# Patient Record
Sex: Male | Born: 1950 | Race: White | Hispanic: No | Marital: Married | Smoking: Former smoker
Health system: Southern US, Community
[De-identification: ages and names within clinical notes are randomized; demographics above are authoritative.]

## PROBLEM LIST (undated history)

## (undated) DIAGNOSIS — I10 Essential (primary) hypertension: Secondary | ICD-10-CM

## (undated) DIAGNOSIS — E119 Type 2 diabetes mellitus without complications: Secondary | ICD-10-CM

---

## 2014-10-22 ENCOUNTER — Ambulatory Visit (INDEPENDENT_AMBULATORY_CARE_PROVIDER_SITE_OTHER): Payer: Medicare Other | Admitting: Family Medicine

## 2014-10-22 ENCOUNTER — Encounter: Payer: Self-pay | Admitting: Family Medicine

## 2014-10-22 VITALS — BP 142/72 | HR 68 | Temp 98.3°F | Ht 65.75 in | Wt 181.0 lb

## 2014-10-22 DIAGNOSIS — N529 Male erectile dysfunction, unspecified: Secondary | ICD-10-CM | POA: Diagnosis not present

## 2014-10-22 DIAGNOSIS — M25529 Pain in unspecified elbow: Secondary | ICD-10-CM | POA: Diagnosis not present

## 2014-10-22 DIAGNOSIS — K219 Gastro-esophageal reflux disease without esophagitis: Secondary | ICD-10-CM | POA: Insufficient documentation

## 2014-10-22 DIAGNOSIS — Z79899 Other long term (current) drug therapy: Secondary | ICD-10-CM

## 2014-10-22 DIAGNOSIS — H269 Unspecified cataract: Secondary | ICD-10-CM | POA: Diagnosis not present

## 2014-10-22 DIAGNOSIS — I10 Essential (primary) hypertension: Secondary | ICD-10-CM | POA: Diagnosis present

## 2014-10-22 DIAGNOSIS — J302 Other seasonal allergic rhinitis: Secondary | ICD-10-CM | POA: Diagnosis not present

## 2014-10-22 DIAGNOSIS — R7989 Other specified abnormal findings of blood chemistry: Secondary | ICD-10-CM | POA: Diagnosis not present

## 2014-10-22 DIAGNOSIS — K5732 Diverticulitis of large intestine without perforation or abscess without bleeding: Secondary | ICD-10-CM | POA: Diagnosis not present

## 2014-10-22 DIAGNOSIS — R748 Abnormal levels of other serum enzymes: Secondary | ICD-10-CM | POA: Diagnosis not present

## 2014-10-22 DIAGNOSIS — N400 Enlarged prostate without lower urinary tract symptoms: Secondary | ICD-10-CM

## 2014-10-22 LAB — COMPREHENSIVE METABOLIC PANEL
ALT: 27 U/L (ref 0–53)
AST: 21 U/L (ref 0–37)
Albumin: 4.4 g/dL (ref 3.5–5.2)
Alkaline Phosphatase: 55 U/L (ref 39–117)
BILIRUBIN TOTAL: 0.4 mg/dL (ref 0.2–1.2)
BUN: 24 mg/dL — ABNORMAL HIGH (ref 6–23)
CALCIUM: 9.9 mg/dL (ref 8.4–10.5)
CO2: 23 mEq/L (ref 19–32)
Chloride: 101 mEq/L (ref 96–112)
Creat: 1.11 mg/dL (ref 0.50–1.35)
GLUCOSE: 111 mg/dL — AB (ref 70–99)
Potassium: 4.3 mEq/L (ref 3.5–5.3)
Sodium: 136 mEq/L (ref 135–145)
Total Protein: 7.5 g/dL (ref 6.0–8.3)

## 2014-10-22 LAB — LIPID PANEL
CHOL/HDL RATIO: 6.1 ratio
CHOLESTEROL: 226 mg/dL — AB (ref 0–200)
HDL: 37 mg/dL — ABNORMAL LOW (ref >=40)
LDL CALC: 150 mg/dL — AB (ref 0–99)
TRIGLYCERIDES: 196 mg/dL — AB (ref <150)
VLDL: 39 mg/dL (ref 0–40)

## 2014-10-22 LAB — CBC
HCT: 46 % (ref 39.0–52.0)
Hemoglobin: 16 g/dL (ref 13.0–17.0)
MCH: 29.1 pg (ref 26.0–34.0)
MCHC: 34.8 g/dL (ref 30.0–36.0)
MCV: 83.6 fL (ref 78.0–100.0)
MPV: 11.3 fL (ref 8.6–12.4)
PLATELETS: 271 10*3/uL (ref 150–400)
RBC: 5.5 MIL/uL (ref 4.22–5.81)
RDW: 14.6 % (ref 11.5–15.5)
WBC: 9.3 10*3/uL (ref 4.0–10.5)

## 2014-10-22 LAB — TSH: TSH: 1.728 u[IU]/mL (ref 0.350–4.500)

## 2014-10-22 LAB — POCT GLYCOSYLATED HEMOGLOBIN (HGB A1C): HEMOGLOBIN A1C: 6.2

## 2014-10-22 MED ORDER — DICLOFENAC SODIUM 1 % TD GEL: 2.0000 g | Freq: Four times a day (QID) | TRANSDERMAL | Status: DC

## 2014-10-22 MED ORDER — TADALAFIL 20 MG PO TABS: 10.0000 mg | ORAL_TABLET | ORAL | Status: DC | PRN

## 2014-10-22 MED ORDER — TAMSULOSIN HCL 0.4 MG PO CAPS: 0.4000 mg | ORAL_CAPSULE | Freq: Every day | ORAL | Status: DC

## 2014-10-22 NOTE — Assessment & Plan Note (Signed)
Tenderness of lateral epicondyles and pain with pronation against resistance. Likely lateral epicondylitis. Will avoid NSAIDs given reported history of elevated creatinine. Will treat with scheduled tylenol and topical voltaren gel. Will proceed with injections and PT referral in 1 month if not improving.

## 2014-10-22 NOTE — Assessment & Plan Note (Signed)
Concern for vascular etiology given lack of nocturnal erections given lack of nocturnal erections. Will check A1c and lipid panel today. Will trial Cialis. If not improvement, would consider referral to urology for further management.

## 2014-10-22 NOTE — Assessment & Plan Note (Deleted)
Reports baseline level of 1.5-1.7. Also reports history of renal cysts.

## 2014-10-22 NOTE — Assessment & Plan Note (Signed)
Check CMP today. Continue supplements.

## 2014-10-22 NOTE — Assessment & Plan Note (Addendum)
At goal today. Continue amlodipine, lisinopril, and metoprolol.   ADDENDUM: Lab results reviewed. A1c in pre-diabetic range. Would also benefit from initiation of statin. Will discuss at next appointment.

## 2014-10-22 NOTE — Progress Notes (Addendum)
Randall Brown is a 64 y.o. male who presents to the [facility] today to establish care. His concerns today include:  HPI: Elbow Pain Patient reports several month history of bilateral elbow pain. Started after moving boxes during recent move to [place]. Initially went to an urgent care where he was given 5 days of prednisone. Was diagnosed with tendonitis. Which helped some. Two months ago went to his family doctor and had an injection in his right arm which helped some. Pain has been the same since then. Tylenol has not helped very much. Also has tried aspercream with lidocaine.   Erectile Dysfunction Patient with several year history. States that he initially had difficulty maintaining an erection but has had more difficulty initiating an erection for at least the past 6 months. Has tried Viagra which did not help. No nocturnal or early morning erections.   Hypertension BP Readings from Last 3 Encounters:  10/22/14 142/72   Home BP monitoring-Yes Compliant with medications-yes without side effects ROS-Denies any CP, HA, SOB, blurry vision, LE edema, transient weakness, orthopnea, PND.   Hypomagnesemia Reports having low magnesium. Unsure of level. Is currently taking daily magnesium supplements.    Elevated creatinine/Renal cysts Reports that his baseline creatinine is 1.5-1.7. Also reports history of renal cysts.    ROS: As per HPI, otherwise all systems reviewed and are negative.  Past Medical History - Reviewed and updated Patient Active Problem List   Diagnosis Date Noted  . Essential hypertension 10/22/2014  . BPH (benign prostatic hyperplasia) 10/22/2014  . Hypomagnesemia 10/22/2014  . Low serum vitamin D 10/22/2014  . Cataracts, both eyes 10/22/2014  . Seasonal allergies 10/22/2014  . GERD (gastroesophageal reflux disease) 10/22/2014  . Diverticulitis of colon 10/22/2014  . Elevated serum creatinine 10/22/2014  . Elbow pain 10/22/2014  . Erectile dysfunction  10/22/2014    Medications- reviewed and updated Current Outpatient Prescriptions  Medication Sig Dispense Refill  . amLODipine (NORVASC) 10 MG tablet Take 10 mg by mouth daily.    Marland Kitchen aspirin 81 MG tablet Take 81 mg by mouth daily.    . CycloSPORINE (RESTASIS OP) Apply to eye.    . diclofenac sodium (VOLTAREN) 1 % GEL Apply 2 g topically 4 (four) times daily. 100 g 1  . famotidine (PEPCID) 20 MG tablet Take 20 mg by mouth 2 (two) times daily.    Marland Kitchen lisinopril (PRINIVIL,ZESTRIL) 20 MG tablet Take 20 mg by mouth daily.    . metoprolol (LOPRESSOR) 50 MG tablet Take 50 mg by mouth 2 (two) times daily.    . Multiple Vitamin (MULTIVITAMIN) tablet Take 1 tablet by mouth daily.    . Omega-3 Fatty Acids (FISH OIL PO) Take by mouth.    . tadalafil (CIALIS) 20 MG tablet Take 0.5-1 tablets (10-20 mg total) by mouth every other day as needed for erectile dysfunction. 5 tablet 11  . tamsulosin (FLOMAX) 0.4 MG CAPS capsule Take 1 capsule (0.4 mg total) by mouth daily. 30 capsule 3   No current facility-administered medications for this visit.    Objective: Physical Exam: BP 142/72 mmHg  Pulse 68  Temp(Src) 98.3 F (36.8 C) (Oral)  Ht 5' 5.75" (1.67 m)  Wt 181 lb (82.101 kg)  BMI 29.44 kg/m2  Gen: NAD, resting comfortably CV: RRR with no murmurs appreciated Lungs: NWOB, CTAB with no crackles, wheezes, or rhonchi Abdomen: Normal bowel sounds present. Soft, Nontender, Nondistended. Ext: no edema MSK: Bilateral elbows without effusion or erythema. Mildly tender over lateral epicondyle. Pain  elicited with pronation against resistance bilaterally.  Skin: warm, dry Neuro: grossly normal, moves all extremities  Results for orders placed or performed in visit on 10/22/14 (from the past 72 hour(s))  CBC     Status: None   Collection Time: 10/22/14  9:55 AM  Result Value Ref Range   WBC 9.3 4.0 - 10.5 K/uL   RBC 5.50 4.22 - 5.81 MIL/uL   Hemoglobin 16.0 13.0 - 17.0 g/dL   HCT 16.1 09.6 - 04.5 %    MCV 83.6 78.0 - 100.0 fL   MCH 29.1 26.0 - 34.0 pg   MCHC 34.8 30.0 - 36.0 g/dL   RDW 40.9 81.1 - 91.4 %   Platelets 271 150 - 400 K/uL   MPV 11.3 8.6 - 12.4 fL  Comprehensive metabolic panel     Status: Abnormal   Collection Time: 10/22/14  9:55 AM  Result Value Ref Range   Sodium 136 135 - 145 mEq/L   Potassium 4.3 3.5 - 5.3 mEq/L   Chloride 101 96 - 112 mEq/L   CO2 23 19 - 32 mEq/L   Glucose, Bld 111 (H) 70 - 99 mg/dL   BUN 24 (H) 6 - 23 mg/dL   Creat 7.82 9.56 - 2.13 mg/dL   Total Bilirubin 0.4 0.2 - 1.2 mg/dL   Alkaline Phosphatase 55 39 - 117 U/L   AST 21 0 - 37 U/L   ALT 27 0 - 53 U/L   Total Protein 7.5 6.0 - 8.3 g/dL   Albumin 4.4 3.5 - 5.2 g/dL   Calcium 9.9 8.4 - 08.6 mg/dL  Lipid Panel     Status: Abnormal   Collection Time: 10/22/14  9:55 AM  Result Value Ref Range   Cholesterol 226 (H) 0 - 200 mg/dL    Comment: ATP III Classification:       < 200        mg/dL        Desirable      578 - 239     mg/dL        Borderline High      >= 240        mg/dL        High      Triglycerides 196 (H) <150 mg/dL   HDL 37 (L) >=46 mg/dL    Comment: ** Please note change in reference range(s). **   Total CHOL/HDL Ratio 6.1 Ratio   VLDL 39 0 - 40 mg/dL   LDL Cholesterol 962 (H) 0 - 99 mg/dL    Comment:   Total Cholesterol/HDL Ratio:CHD Risk                        Coronary Heart Disease Risk Table                                        Men       Women          1/2 Average Risk              3.4        3.3              Average Risk              5.0        4.4  2X Average Risk              9.6        7.1           3X Average Risk             23.4       11.0 Use the calculated Patient Ratio above and the CHD Risk table  to determine the patient's CHD Risk. ATP III Classification (LDL):       < 100        mg/dL         Optimal      045 - 129     mg/dL         Near or Above Optimal      130 - 159     mg/dL         Borderline High      160 - 189     mg/dL          High       > 409        mg/dL         Very High     POCT glycosylated hemoglobin (Hb A1C)     Status: None   Collection Time: 10/22/14  9:58 AM  Result Value Ref Range   Hemoglobin A1C 6.2   TSH     Status: None   Collection Time: 10/22/14 10:17 AM  Result Value Ref Range   TSH 1.728 0.350 - 4.500 uIU/mL    A/P: See problem list  Elbow pain Tenderness of lateral epicondyles and pain with pronation against resistance. Likely lateral epicondylitis. Will avoid NSAIDs given reported history of elevated creatinine. Will treat with scheduled tylenol and topical voltaren gel. Will proceed with injections and PT referral in 1 month if not improving.   Erectile dysfunction Concern for vascular etiology given lack of nocturnal erections given lack of nocturnal erections. Will check A1c and lipid panel today. Will trial Cialis. If not improvement, would consider referral to urology for further management.   Hypomagnesemia Check CMP today. Continue supplements.   Elevated serum creatinine CMP today. Possible referral to nephrology if CKD3 or higher. Will obtain UA at next visit to evaluate for proteinuria.   Essential hypertension At goal today. Continue amlodipine, lisinopril, and metoprolol.   ADDENDUM: Lab results reviewed. A1c in pre-diabetic range. Would also benefit from initiation of statin. Will discuss at next appointment.     Orders Placed This Encounter  Procedures  . CBC  . Comprehensive metabolic panel  . Lipid Panel  . TSH  . POCT glycosylated hemoglobin (Hb A1C)    Meds ordered this encounter  Medications  . diclofenac sodium (VOLTAREN) 1 % GEL    Sig: Apply 2 g topically 4 (four) times daily.    Dispense:  100 g    Refill:  1  . tadalafil (CIALIS) 20 MG tablet    Sig: Take 0.5-1 tablets (10-20 mg total) by mouth every other day as needed for erectile dysfunction.    Dispense:  5 tablet    Refill:  11  . tamsulosin (FLOMAX) 0.4 MG CAPS capsule    Sig: Take 1  capsule (0.4 mg total) by mouth daily.    Dispense:  30 capsule    Refill:  3  . lisinopril (PRINIVIL,ZESTRIL) 20 MG tablet    Sig: Take 20 mg by mouth daily.  Marland Kitchen amLODipine (NORVASC) 10 MG tablet  Sig: Take 10 mg by mouth daily.  . metoprolol (LOPRESSOR) 50 MG tablet    Sig: Take 50 mg by mouth 2 (two) times daily.  . Multiple Vitamin (MULTIVITAMIN) tablet    Sig: Take 1 tablet by mouth daily.  . Omega-3 Fatty Acids (FISH OIL PO)    Sig: Take by mouth.  . CycloSPORINE (RESTASIS OP)    Sig: Apply to eye.  . famotidine (PEPCID) 20 MG tablet    Sig: Take 20 mg by mouth 2 (two) times daily.  Marland Kitchen aspirin 81 MG tablet    Sig: Take 81 mg by mouth daily.     Katina Degree. Jimmey Ralph, MD [facility] Medicine Resident PGY-1 10/23/2014 8:43 AM

## 2014-10-22 NOTE — Patient Instructions (Signed)
Thank you for coming to the clinic today. It was nice seeing you.  For your tendonitis. Please take tylenol every 6 hours. We have also prescribed you voltaren gel. Please use this as needed. If your symptoms are not improving in 1 month, please come back and we can discuss repeating the injections.  For your ED, we will send in a prescription for cialis today. If this does not help, you may need a referral to urology. Please call us and let us know if this is the case.  We will be checking your cholesterol, blood sugar, kidney function, and blood counts today. You should receive a call or a letter in the mail with these results.  Please come back in 3 months or sooner if you need anything else.

## 2014-10-22 NOTE — Assessment & Plan Note (Signed)
CMP today. Possible referral to nephrology if CKD3 or higher. Will obtain UA at next visit to evaluate for proteinuria.

## 2014-10-23 ENCOUNTER — Encounter: Payer: Self-pay | Admitting: Family Medicine

## 2014-12-06 ENCOUNTER — Encounter: Payer: Self-pay | Admitting: Family Medicine

## 2014-12-06 NOTE — Telephone Encounter (Deleted)
Patient want to have a referral to a dermatologist for removal of skin tags

## 2014-12-17 NOTE — Telephone Encounter (Signed)
This encounter was created in error - please disregard.

## 2015-01-07 ENCOUNTER — Encounter: Payer: Self-pay | Admitting: Family Medicine

## 2015-01-07 ENCOUNTER — Other Ambulatory Visit: Payer: Self-pay | Admitting: Family Medicine

## 2015-01-07 MED ORDER — AMLODIPINE BESYLATE 10 MG PO TABS: 10.0000 mg | ORAL_TABLET | Freq: Every day | ORAL | Status: DC

## 2015-01-21 ENCOUNTER — Encounter: Payer: Self-pay | Admitting: Family Medicine

## 2015-01-21 ENCOUNTER — Ambulatory Visit (INDEPENDENT_AMBULATORY_CARE_PROVIDER_SITE_OTHER): Payer: Medicare Other | Admitting: Family Medicine

## 2015-01-21 VITALS — BP 144/77 | HR 85 | Temp 98.6°F | Ht 65.75 in | Wt 190.0 lb

## 2015-01-21 DIAGNOSIS — Z23 Encounter for immunization: Secondary | ICD-10-CM

## 2015-01-21 DIAGNOSIS — R7309 Other abnormal glucose: Secondary | ICD-10-CM

## 2015-01-21 DIAGNOSIS — E119 Type 2 diabetes mellitus without complications: Secondary | ICD-10-CM | POA: Insufficient documentation

## 2015-01-21 DIAGNOSIS — N529 Male erectile dysfunction, unspecified: Secondary | ICD-10-CM | POA: Diagnosis not present

## 2015-01-21 DIAGNOSIS — E785 Hyperlipidemia, unspecified: Secondary | ICD-10-CM | POA: Insufficient documentation

## 2015-01-21 DIAGNOSIS — I10 Essential (primary) hypertension: Secondary | ICD-10-CM | POA: Diagnosis not present

## 2015-01-21 DIAGNOSIS — M79602 Pain in left arm: Secondary | ICD-10-CM

## 2015-01-21 DIAGNOSIS — R7303 Prediabetes: Secondary | ICD-10-CM

## 2015-01-21 MED ORDER — SITAGLIPTIN PHOSPHATE 100 MG PO TABS: 100.0000 mg | ORAL_TABLET | Freq: Every day | ORAL | Status: DC

## 2015-01-21 MED ORDER — ATORVASTATIN CALCIUM 40 MG PO TABS: 40.0000 mg | ORAL_TABLET | Freq: Every day | ORAL | Status: DC

## 2015-01-21 NOTE — Assessment & Plan Note (Signed)
Well controlled today. Will continue metoprolol, lisinopril, and amlodipine.

## 2015-01-21 NOTE — Assessment & Plan Note (Signed)
A1c 6.2 in June. Given patient's history of CKD, will not start Metformin at this time. Will start Januvia at this time. Can also consider sulfonylurea or SGLT-2 inhibitor at next office visit if patient intolerant or unable to afford. Would not stack therapy until A1c >7.0. Encouraged healthy lifestyles choices.

## 2015-01-21 NOTE — Assessment & Plan Note (Signed)
Patient unable to afford cialis. Less likely to be psychogenic etiology given lack of nocturnal or early morning erections. Doubt vascular etiologies given lack of other signs of peripheral vascular disease, though patient is at increased risk. Will refer to urology.

## 2015-01-21 NOTE — Progress Notes (Signed)
Subjective:  Randall Brown is a 64 y.o. male who presents to the [facility] today with a chief complaint of erectile dysfunction follow up.   HPI:  ED Discussed at last office visit. Was prescribed cialis at that time, but was unable to pick up prescription because of cost. Has continued to have difficulties initiating an erection. Has been on viagra in the past which has not helped. No nocturnal or early morning erections.   Prediabetes A1c 6.2 in June. Patient currently not on any medications. States that he has gained about 10 pounds since last visit. States that he tries to walk about 15 minutes every day but is unable to tolerate fully. Continues to endorse poor eating habits with lots of fried foods.  ROS- Denies Polyuria,Polydipsia, nocturia, Vision changes, feet or hand numbness/pain/tingling. Denies  Hypoglycemia symptoms (shaky, sweaty, hungry, weak anxious, tremor, palpitations, confusion, behavior change).   Hypertension BP Readings from Last 3 Encounters:  01/21/15 144/77  10/22/14 142/72   Home BP monitoring-Yes Compliant with medications-yes without side effects ROS-Denies any CP, HA, SOB, blurry vision, LE edema, transient weakness, orthopnea, PND.   Left Arm Pain / Numbness. Started about a month ago. Happens daily and occurs several times per day. States that he feels like his arm is falling asleep. Numbness mostly located in first three digits. No trauma, injury, or precipitating events. Has never had anything like this before. Currently stable. No medications tried.   ROS: Per HPI  PMH:  The following were reviewed and entered/updated in epic: No past medical history on file. Patient Active Problem List   Diagnosis Date Noted  . Prediabetes 01/21/2015  . Hyperlipidemia 01/21/2015  . Left arm pain 01/21/2015  . Essential hypertension 10/22/2014  . BPH (benign prostatic hyperplasia) 10/22/2014  . Hypomagnesemia 10/22/2014  . Low serum vitamin D 10/22/2014    . Cataracts, both eyes 10/22/2014  . Seasonal allergies 10/22/2014  . GERD (gastroesophageal reflux disease) 10/22/2014  . Diverticulitis of colon 10/22/2014  . Elevated serum creatinine 10/22/2014  . Elbow pain 10/22/2014  . Erectile dysfunction 10/22/2014   No past surgical history on file.    Objective:  Physical Exam: BP 144/77 mmHg  Pulse 85  Temp(Src) 98.6 F (37 C) (Oral)  Ht 5' 5.75" (1.67 m)  Wt 190 lb (86.183 kg)  BMI 30.90 kg/m2  Gen: NAD, resting comfortably CV: RRR with no murmurs appreciated Lungs: NWOB, CTAB with no crackles, wheezes, or rhonchi GI: Normal bowel sounds present. Soft, Nontender, Nondistended. MSK:  - Left arm: No deformities. Nontender to palpation. Strength 5/5 throughout. Negative Tinel's at medial epicondyle. Negative Phalen. - Neck: No step offs or deformities. Negative Spurling. FROM Skin: warm, dry Neuro: grossly normal, moves all extremities Psych: Normal affect and thought content   Assessment/Plan:  Erectile dysfunction Patient unable to afford cialis. Less likely to be psychogenic etiology given lack of nocturnal or early morning erections. Doubt vascular etiologies given lack of other signs of peripheral vascular disease, though patient is at increased risk. Will refer to urology.   Prediabetes A1c 6.2 in June. Given patient's history of CKD, will not start Metformin at this time. Will start Januvia at this time. Can also consider sulfonylurea or SGLT-2 inhibitor at next office visit if patient intolerant or unable to afford. Would not stack therapy until A1c >7.0. Encouraged healthy lifestyles choices.   Essential hypertension Well controlled today. Will continue metoprolol, lisinopril, and amlodipine.   Hyperlipidemia Will start atorvastatin  daily today. Continue daily  baby aspirin.   Left arm pain Likely secondary to slight nerve irritation. Anticipate improvement with rest and time. Did not prescribe a course of NSAIDS  today due to CKD. If continues to have issues, may consider a short steroid burst.     Caleb M. Jimmey Ralph, MD [facility] Family Medicine Resident PGY-2 01/21/2015 4:25 PM

## 2015-01-21 NOTE — Patient Instructions (Signed)
Thank you for coming to the clinic today. It was nice seeing you.  We will refer you to urology.  For your blood sugar, we will start a new medication called Januvia today. This medication is usually well tolerated. We will recheck your sugar in 3 months.  We will start a new medication called atorvastatin for your cholesterol today. This medication can cause muscle aches. Please let us know if that happens.  Your blood pressure looked good today.  Your arm pain is likely due to an inflammed nerve. This usually goes away with time. If you are still having problems, please let us know so that we can do further work up.  See you again in 3 months.  Take care,  Dr Jimmey Ralph

## 2015-01-21 NOTE — Assessment & Plan Note (Signed)
Likely secondary to slight nerve irritation. Anticipate improvement with rest and time. Did not prescribe a course of NSAIDS today due to CKD. If continues to have issues, may consider a short steroid burst.

## 2015-01-21 NOTE — Assessment & Plan Note (Signed)
Will start atorvastatin  daily today. Continue daily baby aspirin.

## 2015-03-01 ENCOUNTER — Other Ambulatory Visit: Payer: Self-pay | Admitting: Family Medicine

## 2015-03-01 NOTE — Telephone Encounter (Signed)
Rx filled.  Randall Degreealeb M. Jimmey RalphParker, MD [facility] Resident PGY-2 03/01/2015 11:07 AM

## 2015-03-02 ENCOUNTER — Encounter: Payer: Self-pay | Admitting: Family Medicine

## 2015-03-04 MED ORDER — TAMSULOSIN HCL 0.4 MG PO CAPS: 0.4000 mg | ORAL_CAPSULE | Freq: Every day | ORAL | Status: DC

## 2015-03-04 NOTE — Telephone Encounter (Signed)
Rx filled.  Katina Degreealeb M. Jimmey RalphParker, MD [facility] Resident PGY-2 03/04/2015 8:53 AM

## 2015-04-18 ENCOUNTER — Other Ambulatory Visit: Payer: Self-pay | Admitting: Family Medicine

## 2015-04-18 NOTE — Telephone Encounter (Signed)
Rx filled.  Randall Degreealeb M. Jimmey RalphParker, MD [facility] Resident PGY-2 04/18/2015 5:40 PM

## 2015-07-09 ENCOUNTER — Ambulatory Visit (INDEPENDENT_AMBULATORY_CARE_PROVIDER_SITE_OTHER): Payer: Medicare Other | Admitting: Family Medicine

## 2015-07-09 ENCOUNTER — Encounter: Payer: Self-pay | Admitting: Family Medicine

## 2015-07-09 VITALS — BP 160/76 | HR 76 | Temp 97.5°F | Ht 65.75 in | Wt 196.2 lb

## 2015-07-09 DIAGNOSIS — H6122 Impacted cerumen, left ear: Secondary | ICD-10-CM

## 2015-07-09 NOTE — Progress Notes (Signed)
   Subjective: CC: decreased hearing/ clogged ear Randall Brown is a 65 y.o. male presenting to clinic today for same day appointment. PCP: Jacquiline Doe, MD Concerns today include:  1. Decreased hearing/ clogged ear Patient reports that his Left ear is clogged with wax.  He notes that he frequently uses q-tips.  His wife tried to remove wax with peroxide last night with no success.  He reports that he used to see an ENT at least once yearly to have his ears flushed.  Notes decreased hearing due to wax but otherwise normal hearing.  No cough, congestion.  FamHx and MedHx reviewed.  ROS: Per HPI  Objective: Office vital signs reviewed. BP 160/76 mmHg  Pulse 76  Temp(Src) 97.5 F (36.4 C) (Oral)  Ht 5' 5.75" (1.67 m)  Wt 196 lb 3.2 oz (88.996 kg)  BMI 31.91 kg/m2  Physical Examination:  General: Awake, alert, well nourished, No acute distress HEENT: Normal    Neck: No masses palpated. No lymphadenopathy    Ears: R Tympanic membranes intact, normal light reflex, no erythema, no bulging; Left TM occluded by dark wax.      Eyes: PERRLA, EOMI    Nose: nasal turbinates moist    Throat: moist mucus membranes, no erythema  Assessment/ Plan: 65 y.o. male   1. Cerumen impaction, left - Ear flush performed w/ peroxide water mix. Patient tolerated flush well.  Flush successful with complete removal of cerumen and improvement in hearing. - Recommended using Debrox/ peroxide solution before coming to office for flushes in the future. - Recommended AGAINST continued QTip use - Follow up with Dr Jimmey Ralph as scheduled.   Raliegh Ip, DO PGY-2, [facility]

## 2015-07-22 ENCOUNTER — Encounter: Payer: Self-pay | Admitting: Family Medicine

## 2015-07-22 ENCOUNTER — Ambulatory Visit (INDEPENDENT_AMBULATORY_CARE_PROVIDER_SITE_OTHER): Payer: Medicare Other | Admitting: Family Medicine

## 2015-07-22 VITALS — BP 130/88 | HR 71 | Temp 98.2°F | Ht 66.0 in | Wt 197.0 lb

## 2015-07-22 DIAGNOSIS — I10 Essential (primary) hypertension: Secondary | ICD-10-CM | POA: Diagnosis present

## 2015-07-22 DIAGNOSIS — Z1159 Encounter for screening for other viral diseases: Secondary | ICD-10-CM | POA: Diagnosis not present

## 2015-07-22 DIAGNOSIS — Z23 Encounter for immunization: Secondary | ICD-10-CM | POA: Diagnosis not present

## 2015-07-22 DIAGNOSIS — G4719 Other hypersomnia: Secondary | ICD-10-CM | POA: Diagnosis not present

## 2015-07-22 DIAGNOSIS — E785 Hyperlipidemia, unspecified: Secondary | ICD-10-CM

## 2015-07-22 DIAGNOSIS — R7303 Prediabetes: Secondary | ICD-10-CM | POA: Diagnosis not present

## 2015-07-22 DIAGNOSIS — K219 Gastro-esophageal reflux disease without esophagitis: Secondary | ICD-10-CM

## 2015-07-22 DIAGNOSIS — Z79899 Other long term (current) drug therapy: Secondary | ICD-10-CM | POA: Diagnosis not present

## 2015-07-22 DIAGNOSIS — R0683 Snoring: Secondary | ICD-10-CM | POA: Diagnosis not present

## 2015-07-22 DIAGNOSIS — Z114 Encounter for screening for human immunodeficiency virus [HIV]: Secondary | ICD-10-CM | POA: Diagnosis not present

## 2015-07-22 LAB — LDL CHOLESTEROL, DIRECT: LDL DIRECT: 91 mg/dL (ref <130)

## 2015-07-22 LAB — COMPLETE METABOLIC PANEL WITH GFR
ALBUMIN: 4.4 g/dL (ref 3.6–5.1)
ALK PHOS: 57 U/L (ref 40–115)
ALT: 32 U/L (ref 9–46)
AST: 24 U/L (ref 10–35)
BILIRUBIN TOTAL: 0.5 mg/dL (ref 0.2–1.2)
BUN: 24 mg/dL (ref 7–25)
CO2: 28 mmol/L (ref 20–31)
CREATININE: 1.24 mg/dL (ref 0.70–1.25)
Calcium: 10 mg/dL (ref 8.6–10.3)
Chloride: 100 mmol/L (ref 98–110)
GFR, EST NON AFRICAN AMERICAN: 61 mL/min (ref >=60)
GFR, Est African American: 70 mL/min (ref >=60)
GLUCOSE: 133 mg/dL — AB (ref 65–99)
Potassium: 4.7 mmol/L (ref 3.5–5.3)
SODIUM: 138 mmol/L (ref 135–146)
TOTAL PROTEIN: 7.2 g/dL (ref 6.1–8.1)

## 2015-07-22 LAB — CBC
HCT: 45.1 % (ref 39.0–52.0)
HEMOGLOBIN: 15.1 g/dL (ref 13.0–17.0)
MCH: 29.3 pg (ref 26.0–34.0)
MCHC: 33.5 g/dL (ref 30.0–36.0)
MCV: 87.4 fL (ref 78.0–100.0)
MPV: 11.1 fL (ref 8.6–12.4)
PLATELETS: 212 10*3/uL (ref 150–400)
RBC: 5.16 MIL/uL (ref 4.22–5.81)
RDW: 13.8 % (ref 11.5–15.5)
WBC: 8.8 10*3/uL (ref 4.0–10.5)

## 2015-07-22 LAB — POCT GLYCOSYLATED HEMOGLOBIN (HGB A1C): Hemoglobin A1C: 7.3

## 2015-07-22 LAB — MAGNESIUM: Magnesium: 1.4 mg/dL — ABNORMAL LOW (ref 1.5–2.5)

## 2015-07-22 MED ORDER — RANITIDINE HCL 150 MG PO TABS: 150.0000 mg | ORAL_TABLET | Freq: Two times a day (BID) | ORAL | Status: DC

## 2015-07-22 NOTE — Assessment & Plan Note (Signed)
Will stop famotidine and start ranitidine. If still having symptoms, will consider short trial of PPI.

## 2015-07-22 NOTE — Assessment & Plan Note (Signed)
Will check A1c today. Continue [place]uvia.

## 2015-07-22 NOTE — Progress Notes (Signed)
    Subjective:  Randall Brown is a 65 y.o. male [facility] with a chief complaint of hypertension follow up.   HPI:  Hypertension BP Readings from Last 3 Encounters:  07/22/15 130/88  07/09/15 160/76  01/21/15 144/77   Home BP monitoring-No Compliant with medications-yes, without side effects ROS-Denies any CP, HA, SOB, blurry vision, LE edema, transient weakness, orthopnea, PND.   Prediabetes Started on [place]ajanuvia approximately 6 months ago. Has tolerated well without side effects.   HLD Started on lipitor 40mg  six months ago. Initially had some muscle aches, but has been doing well since then without side effects.   GERD Has been taking pepcid twice daily, however symptoms have worsened over the past several weeks. Was previously well controlled on nexium, however patient would like to avoid PPIs if possible due to the side effects.   Daytime Sleepiness Patient notes excessive daytime sleepiness for the past 3-4 months. He can fall asleep after sitting down for less than 5 minutes. Patient sometimes will doze off during conversations. Patient does have a history of snoring and says that is wife sometimes has to wake him up at night to breath. Had a sleep study done 20 years ago. Does not think that he will be able to wear a CPAP machine due to claustrophobia.   ROS: Per HPI  PMH: Smoking history reviewed.   Objective:  Physical Exam: BP 130/88 mmHg  Pulse 71  Temp(Src) 98.2 F (36.8 C) (Oral)  Ht 5\' 6"  (1.676 m)  Wt 197 lb (89.359 kg)  BMI 31.81 kg/m2  Gen: NAD, resting comfortably CV: RRR with no murmurs appreciated Pulm: NWOB, CTAB with no crackles, wheezes, or rhonchi GI: Obese, Normal bowel sounds present. Soft, Nontender, Nondistended. MSK: no edema, cyanosis, or clubbing noted Skin: warm, dry Neuro: grossly normal, moves all extremities Psych: Normal affect and thought content  Assessment/Plan:  Essential hypertension Well controlled  today. Continue amlodipine, lisinopril, and metoprolol.   Prediabetes Will check A1c today. Continue [place]uvia.   Hyperlipidemia Will check direct LDL today. Continue atorvastatin 40mg  daily.   GERD (gastroesophageal reflux disease) Will stop famotidine and start ranitidine. If still having symptoms, will consider short trial of PPI.   Excessive daytime sleepiness Concern for OSA given constellation of snoring, apneic episodes, and daytime sleepiness. Will obtain sleep study.   Katina Degreealeb M. Jimmey RalphParker, MD [facility] Resident PGY-2 07/22/2015 12:07 PM

## 2015-07-22 NOTE — Patient Instructions (Signed)
Please continue your medications.  We will check blood work today.  Please try taking ranitidine to see if that helps with your reflux.  We will set you up to have a sleep study done.  Please come back in 3-6 months, or sooner if you need anything else.  Take care,  Dr Jimmey RalphParker

## 2015-07-22 NOTE — Assessment & Plan Note (Signed)
Will check direct LDL today. Continue atorvastatin 40mg  daily.

## 2015-07-22 NOTE — Assessment & Plan Note (Signed)
Well controlled today. Continue amlodipine, lisinopril, and metoprolol.

## 2015-07-22 NOTE — Assessment & Plan Note (Signed)
Concern for OSA given constellation of snoring, apneic episodes, and daytime sleepiness. Will obtain sleep study.

## 2015-07-23 LAB — HIV ANTIBODY (ROUTINE TESTING W REFLEX): HIV: NONREACTIVE

## 2015-07-23 LAB — HEPATITIS C ANTIBODY: HCV Ab: NEGATIVE

## 2015-07-24 ENCOUNTER — Ambulatory Visit: Payer: Medicare Other | Admitting: Family Medicine

## 2015-07-26 ENCOUNTER — Ambulatory Visit (HOSPITAL_BASED_OUTPATIENT_CLINIC_OR_DEPARTMENT_OTHER): Payer: Medicare Other | Attending: Family Medicine

## 2015-07-26 VITALS — Ht 65.0 in | Wt 195.0 lb

## 2015-07-26 DIAGNOSIS — Z6832 Body mass index (BMI) 32.0-32.9, adult: Secondary | ICD-10-CM | POA: Insufficient documentation

## 2015-07-26 DIAGNOSIS — E669 Obesity, unspecified: Secondary | ICD-10-CM | POA: Diagnosis not present

## 2015-07-26 DIAGNOSIS — I119 Hypertensive heart disease without heart failure: Secondary | ICD-10-CM | POA: Diagnosis present

## 2015-07-26 DIAGNOSIS — R5383 Other fatigue: Secondary | ICD-10-CM | POA: Diagnosis not present

## 2015-07-26 DIAGNOSIS — G4736 Sleep related hypoventilation in conditions classified elsewhere: Secondary | ICD-10-CM | POA: Diagnosis not present

## 2015-07-26 DIAGNOSIS — Z79899 Other long term (current) drug therapy: Secondary | ICD-10-CM | POA: Insufficient documentation

## 2015-07-26 DIAGNOSIS — R0683 Snoring: Secondary | ICD-10-CM | POA: Diagnosis not present

## 2015-07-26 DIAGNOSIS — Z7982 Long term (current) use of aspirin: Secondary | ICD-10-CM | POA: Diagnosis not present

## 2015-07-26 DIAGNOSIS — G4733 Obstructive sleep apnea (adult) (pediatric): Secondary | ICD-10-CM | POA: Diagnosis not present

## 2015-07-29 ENCOUNTER — Telehealth: Payer: Self-pay | Admitting: Family Medicine

## 2015-07-29 NOTE — Telephone Encounter (Signed)
Called patient to discuss results. A1c elevated and magnesium slightly below normal. No new changes today, though will likely increased DM regimen at next appointment.  Katina Degreealeb M. Jimmey RalphParker, MD [facility] Resident PGY-2 07/29/2015 3:42 PM

## 2015-08-03 DIAGNOSIS — R0683 Snoring: Secondary | ICD-10-CM | POA: Diagnosis not present

## 2015-08-03 NOTE — Progress Notes (Signed)
  Patient Name: Randall Brown, Randall Brown Study Date: 07/26/2015 Gender: Male D.O.B: [birth year] Age (years): [age] Referring Provider: Ardith Darkaleb M Parker Height (inches): 65 Interpreting Physician: Jetty Duhamellinton Young MD, ABSM Weight (lbs): 195 RPSGT: Randall Brown, Fred BMI: 32 MRN: [id number] Neck Size: 17.50 CLINICAL INFORMATION Sleep Study Type: NPSG Indication for sleep study: Fatigue, Hypertension, Obesity, OSA, Snoring, Witnessed Apneas Epworth Sleepiness Score: 21  SLEEP STUDY TECHNIQUE As per the AASM Manual for the Scoring of Sleep and Associated Events v2.3 (April 2016) with a hypopnea requiring 4% desaturations. The channels recorded and monitored were frontal, central and occipital EEG, electrooculogram (EOG), submentalis EMG (chin), nasal and oral airflow, thoracic and abdominal wall motion, anterior tibialis EMG, snore microphone, electrocardiogram, and pulse oximetry.  MEDICATIONS Patient's medications include: charted for review Medications self-administered by patient during sleep study : ATORVASTATIN, ASPIRIN, METOPROLOL, TAMSULOSIN HYDROCHLORIDE, ZYRTEC, LISINOPRIL, MAGNESIUM CARBONATE, CHLOROTHIAZIDE, FISH OIL.  SLEEP ARCHITECTURE The study was initiated at 10:33:03 PM and ended at 5:29:04 AM. Sleep onset time was 18.2 minutes and the sleep efficiency was 81.0%. The total sleep time was 336.8 minutes. Stage REM latency was 133.0 minutes. The patient spent 4.75% of the night in stage N1 sleep, 82.04% in stage N2 sleep, 0.00% in stage N3 and 13.21% in REM. Alpha intrusion was absent. Supine sleep was 22.03%.  RESPIRATORY PARAMETERS The overall apnea/hypopnea index (AHI) was 25.5 per hour. There were 30 total apneas, including 17 obstructive, 8 central and 5 mixed apneas. There were 113 hypopneas and 0 RERAs. The AHI during Stage REM sleep was 22.9 per hour. AHI while supine was 59.8 per hour. The mean oxygen saturation was 91.12%. The minimum SpO2 during sleep was 73.00%. Loud  snoring was noted during this study.  CARDIAC DATA The 2 lead EKG demonstrated sinus rhythm. The mean heart rate was 70.66 beats per minute. Other EKG findings include: None.  LEG MOVEMENT DATA The total PLMS were 288 with a resulting PLMS index of 51.31. Associated arousal with leg movement index was 0.2 .  IMPRESSIONS - Moderate obstructive sleep apnea occurred during this study (AHI = 25.5/h). - There were not enough early events to meet protocol requirements for split CPAP titration. - No significant central sleep apnea occurred during this study (CAI = 1.4/h). - Moderate oxygen desaturation was noted during this study (Min O2 = 73.00%). - The patient snored with Loud snoring volume. - No cardiac abnormalities were noted during this study. - Severe periodic limb movements of sleep occurred during the study. No significant associated arousals  DIAGNOSIS - Obstructive Sleep Apnea (327.23 [G47.33 ICD-10]) - Nocturnal Hypoxemia (327.26 [G47.36 ICD-10])  RECOMMENDATIONS - Therapeutic CPAP titration to determine optimal pressure required to alleviate sleep disordered breathing. - Positional therapy avoiding supine position during sleep. - Avoid alcohol, sedatives and other CNS depressants that may worsen sleep apnea and disrupt normal sleep architecture. - Sleep hygiene should be reviewed to assess factors that may improve sleep quality. - Weight management and regular exercise should be initiated or continued if appropriate.  Waymon BudgeYOUNG,CLINTON D Diplomate, American Board of Sleep Medicine  ELECTRONICALLY SIGNED ON:  08/03/2015, 10:57 AM [facility] PH: [phone]   FX: [phone] ACCREDITED BY THE AMERICAN ACADEMY OF SLEEP MEDICINE

## 2015-08-13 ENCOUNTER — Ambulatory Visit (INDEPENDENT_AMBULATORY_CARE_PROVIDER_SITE_OTHER): Payer: Medicare Other | Admitting: Family Medicine

## 2015-08-13 ENCOUNTER — Encounter: Payer: Self-pay | Admitting: Family Medicine

## 2015-08-13 VITALS — BP 142/65 | HR 77 | Temp 98.2°F | Wt 194.4 lb

## 2015-08-13 DIAGNOSIS — R1030 Lower abdominal pain, unspecified: Secondary | ICD-10-CM

## 2015-08-13 DIAGNOSIS — R1032 Left lower quadrant pain: Secondary | ICD-10-CM

## 2015-08-13 DIAGNOSIS — M25552 Pain in left hip: Secondary | ICD-10-CM

## 2015-08-13 DIAGNOSIS — M25551 Pain in right hip: Secondary | ICD-10-CM

## 2015-08-13 NOTE — Patient Instructions (Signed)
You may have a small hernia causing your pain. Heavy lifting will make this worse.  Take the tylenol 500-1000mg  every 4-8 hours. No more than 4000mg  in a 24 hour period (can cause liver damage)   The address for [facility] is below:  Address: [address], [place], [place] [address] [phone]  Address: [address], [place], [place] [address] Phone:(336) [phone]

## 2015-08-13 NOTE — Progress Notes (Signed)
   Subjective:    Patient ID: Randall Brown, male    DOB: 09/05/1950, 65 y.o.   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 Pain developed somewhat gradually, not after any lifting.  Pain was "extreme", located in left groin, at its worse was "32 out of 10"; now feels like a dull ache 1/10.   Pain does not radiate  Better with sitting down, worse with prolonged standing, walking  Has tried tylenol and helps some  Does not feel like he has had any masses or swelling in the area  No major trauma to the area that he knows  Denies any weakness  Does have numbness on the front outside of his left thigh that radiates down to his thigh, however this is chronic >3 months and he has seen PCP for this with suspected increase in weight causing pressure on nerve.   Also has a history of bilateral hip pain, present for at least several years, worse walking up steps ROS: no bowel or bladder incontinence.  Social Hx: former smoker  Review of Systems   See HPI for ROS.   Past medical history, surgical, family, and social history reviewed and updated in the EMR as appropriate.  Objective:  BP 142/65 mmHg  Pulse 77  Temp(Src) 98.2 F (36.8 C) (Oral)  Wt 194 lb 6.4 oz (88.179 kg) Vitals and nursing note reviewed  General: no apparent distress  MSK: there is tenderness to the inguinal groin crease on the left side, no obvious mass. Hernia check showed no evidence of bulge with valsalva. There is no crepitation or swelling around the hip joint. Log roll test is normal, FABER and FABIR tests normal with no limitation of ROM.   Assessment & Plan:   1. Left groin pain ?inguinal hernia vs MSK. Recommended observation and rest, tylenol, follow up if not improving or develops swelling/mass in next 4-6 weeks.  2. Bilateral hip  pain During discussion brings up that he has had a lot of pain in both hips for years, including going up steps. Recommended initial imaging to evaluate for OA. Follow up as needed. - DG HIPS BILAT WITH PELVIS MIN 5 VIEWS; Future

## 2015-08-14 ENCOUNTER — Encounter: Payer: Self-pay | Admitting: Family Medicine

## 2015-08-14 ENCOUNTER — Telehealth: Payer: Self-pay | Admitting: Family Medicine

## 2015-08-14 NOTE — Telephone Encounter (Signed)
Pt has decided that he does want to have the hernia surgery. Please do the referral. jw

## 2015-08-14 NOTE — Telephone Encounter (Signed)
Will forward to Dr. Waynetta SandyWight who saw patient for this concern.  Blakely Gluth,CMA

## 2015-08-15 ENCOUNTER — Encounter: Payer: Self-pay | Admitting: Family Medicine

## 2015-08-15 ENCOUNTER — Other Ambulatory Visit: Payer: Self-pay | Admitting: Family Medicine

## 2015-08-16 ENCOUNTER — Ambulatory Visit
Admission: RE | Admit: 2015-08-16 | Discharge: 2015-08-16 | Disposition: A | Discharge status: Home / Self Care | Payer: Medicare Other | Source: Ambulatory Visit | Attending: Family Medicine | Admitting: Family Medicine

## 2015-08-16 ENCOUNTER — Telehealth: Payer: Self-pay | Admitting: Family Medicine

## 2015-08-16 DIAGNOSIS — M25551 Pain in right hip: Secondary | ICD-10-CM

## 2015-08-16 DIAGNOSIS — M25552 Pain in left hip: Principal | ICD-10-CM

## 2015-08-16 NOTE — Telephone Encounter (Signed)
Spoke with patient, see other note. Patient to see Dr. Kennon RoundsHaney for second eval prior to referral.

## 2015-08-16 NOTE — Telephone Encounter (Signed)
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 He is still having fairly significant pain in his left hip that is present when sitting/at rest but also seems to get worse after prolonged standing. I had discussed with him at our office visit that I didn't really think I was feeling a true hernia on exam, so I asked that he does keep his appointment on Monday with Dr. Kennon RoundsHaney to get re-evaluated/second opinion before referring to general surgery. His pain could potentially be explained by hip OA exacerbation as well. -Dr. Waynetta SandyWight

## 2015-08-19 ENCOUNTER — Ambulatory Visit (INDEPENDENT_AMBULATORY_CARE_PROVIDER_SITE_OTHER): Payer: Medicare Other | Admitting: Student

## 2015-08-19 VITALS — BP 151/99 | HR 96 | Temp 97.9°F | Wt 198.9 lb

## 2015-08-19 DIAGNOSIS — R1030 Lower abdominal pain, unspecified: Secondary | ICD-10-CM

## 2015-08-19 DIAGNOSIS — R1032 Left lower quadrant pain: Secondary | ICD-10-CM

## 2015-08-19 NOTE — Assessment & Plan Note (Addendum)
Inguinal pain concerning hernia  versus musculoskeletal origin. No evidence of hernia on exam. With no current pain. 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

## 2015-08-19 NOTE — Progress Notes (Signed)
   Subjective:    Patient ID: Randall Brown, male    DOB: 08/30/1950, 65 y.o.   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 And is concerned the pain may come back with this. -  denies pain at this time  - Denies weakness or radiation of the pain when he had it.   Bilateral hip pain - Patient reports chronic hip pain - Has had an x-ray on 4/7 which confirmed mild degenerative changes to both hips  Review of Systems ROS Per history of present illness otherwise he denies recent illness, nausea, vomiting, diarrhea, chest pain, trouble breathing  Past Medical, Surgical, Social, and Family History Reviewed & Updated per EMR.   Objective:  BP 151/99 mmHg  Pulse 96  Temp(Src) 97.9 F (36.6 C) (Oral)  Wt 198 lb 14.4 oz (90.22 kg) Vitals and nursing note reviewed  General: NAD Cardiac: RRR,  Respiratory: CTAB, normal effort Abdomen:  Obese, soft, nontender, nondistended, GU: No masses palpated on bilateral inguinal exams, mild soreness to palpation over the left inguinal area, no erythema, no lesions noted. MSK: normal faber and fabir exams, normal leg roll exam. Skin: warm and dry, no rashes noted Neuro: alert and oriented, no focal deficits   Assessment & Plan:   Left inguinal pain  Inguinal pain concerning hernia  versus musculoskeletal origin. No evidence of hernia on exam. With no current pain. 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 Kennon RoundsHaney MD, MS Family Medicine Resident PGY-2 Pager [phone]

## 2015-08-19 NOTE — Patient Instructions (Addendum)
Follow-up with PCP If you have worsening groin pain that does not improve go to the emergency department for evaluation. If you have questions or concerns call the office at [phone]

## 2015-09-02 ENCOUNTER — Encounter: Payer: Self-pay | Admitting: Family Medicine

## 2015-09-05 ENCOUNTER — Ambulatory Visit (INDEPENDENT_AMBULATORY_CARE_PROVIDER_SITE_OTHER): Payer: Medicare Other | Admitting: Family Medicine

## 2015-09-05 ENCOUNTER — Encounter: Payer: Self-pay | Admitting: Family Medicine

## 2015-09-05 VITALS — BP 112/64 | HR 77 | Temp 97.5°F | Ht 65.0 in | Wt 195.0 lb

## 2015-09-05 DIAGNOSIS — R079 Chest pain, unspecified: Secondary | ICD-10-CM | POA: Diagnosis present

## 2015-09-05 DIAGNOSIS — E119 Type 2 diabetes mellitus without complications: Secondary | ICD-10-CM | POA: Insufficient documentation

## 2015-09-05 DIAGNOSIS — H409 Unspecified glaucoma: Secondary | ICD-10-CM | POA: Insufficient documentation

## 2015-09-05 NOTE — Progress Notes (Signed)
   Subjective:    Patient ID: Randall Brown, male    DOB: 11/19/1950, 65 y.o.   MRN: [id number]  HPI  CC: chest pain  # chest pain:  Last Wednesday  Sleeping when the pain started, taking a nap  Developed chest pain in the middle of his sternum and through his back, hurt to breath. Felt like he was punched in the chest.   Pain lasted 3 days  Took 4 baby aspirin -- pain went away  Also has right shoulder pain, had a little bit of numbness.   Has had chest pains in the past, went to urgent cares/hospital, has done stress testing (last done several years ago) -- did treadmill testing. ([place] in [place] Says he has a distant history of cardiac cath in 1980s and has a "tilted" heart that was seen on EKG.  Had a "little" discomfort in the chest once since that episode. Being physically active does not make anything feel worse.  ROS: no cough, no trouble swallowing, +heartburn, no SOB  Social Hx: former smoker, quit 2008 Family hx: brother had heart attack 8-10 years ago (he is 2 years younger)  Review of Systems   See HPI for ROS.   Past medical history, surgical, family, and social history reviewed and updated in the EMR as appropriate. Objective:  BP 112/64 mmHg  Pulse 77  Temp(Src) 97.5 F (36.4 C) (Oral)  Ht 5\' 5"  (1.651 m)  Wt 195 lb (88.451 kg)  BMI 32.45 kg/m2  SpO2 95% Vitals and nursing note reviewed  General: no apparent distress  CV: normal rate, regular rhythm, no murmurs, rubs or gallop. 2+ radial pulses bilaterally  Resp: clear to auscultation bilaterally, normal effort  Assessment & Plan:  1. Chest pain, unspecified chest pain type Does not sound like typical angina or cardiac chest pain, however he does have multiple risk factors including T2DM, HTN, HLD, and family history (brother had MI in his early 3050s). Discussed with him that the best course of action would be to establish/see a [place] in [place] and determine if repeat  stress testing needs to be done. Discussed if symptoms recur he needs to go to the ED. - Ambulatory referral to Cardiology

## 2015-09-05 NOTE — Patient Instructions (Signed)
We will refer you to the cardiologists to determine if you need a repeat stress test

## 2015-10-09 ENCOUNTER — Other Ambulatory Visit: Payer: Self-pay | Admitting: Family Medicine

## 2015-10-09 NOTE — Telephone Encounter (Signed)
Pt is out of his Metoprolol and would like a refill as soon as possible. He doesn't have any refills and took his last pill today. jw

## 2015-10-10 MED ORDER — METOPROLOL TARTRATE 50 MG PO TABS: 50.0000 mg | ORAL_TABLET | Freq: Two times a day (BID) | ORAL | Status: DC

## 2015-10-10 NOTE — Telephone Encounter (Signed)
Rx filled.  Katina Degreealeb M. Jimmey RalphParker, MD [facility] Family Medicine Resident PGY-2 10/10/2015 9:11 AM

## 2015-10-14 ENCOUNTER — Ambulatory Visit: Payer: Medicare Other | Admitting: Cardiovascular Disease

## 2015-10-18 ENCOUNTER — Ambulatory Visit (INDEPENDENT_AMBULATORY_CARE_PROVIDER_SITE_OTHER): Payer: Medicare Other | Admitting: Family Medicine

## 2015-10-18 ENCOUNTER — Encounter: Payer: Self-pay | Admitting: Family Medicine

## 2015-10-18 VITALS — BP 154/88 | HR 78 | Temp 97.6°F | Wt 200.0 lb

## 2015-10-18 DIAGNOSIS — E785 Hyperlipidemia, unspecified: Secondary | ICD-10-CM | POA: Diagnosis not present

## 2015-10-18 DIAGNOSIS — R0600 Dyspnea, unspecified: Secondary | ICD-10-CM

## 2015-10-18 DIAGNOSIS — E118 Type 2 diabetes mellitus with unspecified complications: Secondary | ICD-10-CM | POA: Diagnosis present

## 2015-10-18 DIAGNOSIS — I10 Essential (primary) hypertension: Secondary | ICD-10-CM

## 2015-10-18 LAB — POCT GLYCOSYLATED HEMOGLOBIN (HGB A1C): Hemoglobin A1C: 7

## 2015-10-18 NOTE — Assessment & Plan Note (Signed)
Last LDL less than 100 - Continue Lipitor 40 mg daily at bedtime

## 2015-10-18 NOTE — Assessment & Plan Note (Signed)
BP slightly elevated today, but reports stress around her job.  Says home blood pressures are less than 140/90 - Will have him check home blood pressures twice a day, and return 1-2 weeks to see PCP - Continue Norvasc 10 mg daily, lisinopril 20 mg daily, metoprolol 50 mg twice a day

## 2015-10-18 NOTE — Progress Notes (Signed)
  Patient name: Randall Brown MRN [id number]  Date of birth: 05/05/1951  CC & HPI:  Randall Brown is a 65 y.o. male presenting today for HTN, DM, HLD.   DIABETES  Lab Results  Component Value Date   HGBA1C 7.0 10/18/2015    Blood Sugar Ranges: not checking  Symptoms of Hypoglycemia? no  Comorbid Symptoms:  no Neuropathy: no Vision problems  Medication Side Effects: Denies hypoglycemia  CHRONIC HYPERTENSION BP Readings from Last 3 Encounters:  10/18/15 154/88  09/05/15 112/64  08/19/15 151/99    Disease Monitoring  Blood pressure range outside clinc: reports home reading ~ 120/90  Chest pain: no   Dyspnea: yes, Has been referred back to cardiology for consideration of additional stress testing   Claudication: no  Medication Side Effects: No cough, angioedema,lightheadedness, rash    HLD Lab Results  Component Value Date   LDLCALC 150* 10/22/2014    Medication Compliance: yes  Side Effects?: no muscle pain or weakness, RUQ pain; jaundice  Preventitive Healthcare:  Diet: Has been on Weight Watchers for the past month with no weight loss; was eating a lot of fruit  Exercise: Limited by joint pain   Dyspnea - He continues to report shortness of breath with minimal exertion - Has been previous or for cardiology for consideration of repeat stress testing - Reports negative stress test 2 to 3 years ago - Denies chest pain, palpitations, PND or orthopnea; however does have history of obstructive sleep apnea, does not use CPAP due to extreme claustrophobia - Reports does not exercise due to pain in hips and knees  Smoking History Noted  Objective Findings:  Vitals: BP 154/88 mmHg  Pulse 78  Temp(Src) 97.6 F (36.4 C) (Oral)  Wt 200 lb (90.719 kg)  SpO2 95%  Gen: NAD; Obese CV: RRR w/o m/r/g, pulses +2 b/l Resp: CTAB w/ normal respiratory effort Foot Exam: Pulses 2+; No Ulcers, bruises or cuts; Monofilament testing: Sensation intact b/l   Assessment &  Plan:   Essential hypertension BP slightly elevated today, but reports stress around her job.  Says home blood pressures are less than 140/90 - Will have him check home blood pressures twice a day, and return 1-2 weeks to see PCP - Continue Norvasc 10 mg daily, lisinopril 20 mg daily, metoprolol 50 mg twice a day   Type 2 diabetes mellitus (HCC) Long discussion today about diabetes and diet.  He has been eating a lot of fruit under the Weight Watchers plan.  He has cut out all sugary beverage consumption.  - Provided information with nutritionist and recommended calling to schedule appointment.  - Discussed checking blood sugars every morning 2 to 3 times a week and follow-up with PCP in 2 or 3 weeks - Recheck hemoglobin A1c - Consider metformin if A1c remains above 7  Hyperlipidemia Last LDL less than 100 - Continue Lipitor 40 mg daily at bedtime  Dyspnea Dyspnea with exertion, likely secondary to deconditioning.  Reports negative stress test in the past few years, however, unable to locate these records.  Denies chest pain or palpitations to indicate cardiac arrhythmia or ACS.  No lower extremity swelling or crackles to indicate heart failure - Recommend following up with cardiologist as previously referred for consideration of repeat stress testing - If dyspnea persists or worsens consider echo

## 2015-10-18 NOTE — Patient Instructions (Signed)
It was great seeing you today.   I have order some labs today to check your diabetes. I will send you a letter with the results, or call you if we need to make any changes to your current therapies.  Please call [phone] to schedule an appointment with Nutritionist: Wyona AlmasJeannie Sykes to discuss diabetes and weight loss Check your blood pressure twice a day for the next 1-3 weeks.  Bring these readings to your appointment with Dr. Jimmey RalphParker.  Check your blood sugar in the morning (2-3 days a week) prior to eating or taking medication.    Please bring all your medications to every doctors visit  Sign up for My Chart to have easy access to your labs results, and communication with your Primary care physician.  Next Appointment  Please call to make an appointment with Dr Jimmey RalphParker in 2-3 weeks   I look forward to talking with you again at our next visit. If you have any questions or concerns before then, please call the clinic at [phone].  Take Care,   Dr Wenda LowJames Munira Polson

## 2015-10-18 NOTE — Assessment & Plan Note (Signed)
Dyspnea with exertion, likely secondary to deconditioning.  Reports negative stress test in the past few years, however, unable to locate these records.  Denies chest pain or palpitations to indicate cardiac arrhythmia or ACS.  No lower extremity swelling or crackles to indicate heart failure - Recommend following up with cardiologist as previously referred for consideration of repeat stress testing - If dyspnea persists or worsens consider echo

## 2015-10-18 NOTE — Assessment & Plan Note (Signed)
Long discussion today about diabetes and diet.  He has been eating a lot of fruit under the Weight Watchers plan.  He has cut out all sugary beverage consumption.  - Provided information with nutritionist and recommended calling to schedule appointment.  - Discussed checking blood sugars every morning 2 to 3 times a week and follow-up with PCP in 2 or 3 weeks - Recheck hemoglobin A1c - Consider metformin if A1c remains above 7

## 2015-10-21 ENCOUNTER — Other Ambulatory Visit: Payer: Self-pay | Admitting: Family Medicine

## 2015-10-22 ENCOUNTER — Encounter: Payer: Self-pay | Admitting: Family Medicine

## 2015-10-22 NOTE — Telephone Encounter (Signed)
Rx filled.  Katina Degreealeb M. Jimmey RalphParker, MD [facility] Resident PGY-2 10/22/2015 8:26 AM

## 2015-10-25 ENCOUNTER — Other Ambulatory Visit: Payer: Self-pay | Admitting: *Deleted

## 2015-10-25 MED ORDER — SITAGLIPTIN PHOSPHATE 100 MG PO TABS: 100.0000 mg | ORAL_TABLET | Freq: Every day | ORAL | Status: DC

## 2015-10-30 ENCOUNTER — Encounter: Payer: Self-pay | Admitting: Family Medicine

## 2015-10-30 MED ORDER — BAYER CONTOUR NEXT MONITOR W/DEVICE KIT: PACK | Status: AC

## 2015-10-30 MED ORDER — GLUCOSE BLOOD VI STRP: ORAL_STRIP | Status: AC

## 2015-10-30 MED ORDER — WALGREENS THIN LANCETS MISC: Status: AC

## 2015-11-05 ENCOUNTER — Ambulatory Visit: Payer: Medicare Other | Admitting: Cardiology

## 2015-11-07 ENCOUNTER — Ambulatory Visit: Payer: Medicare Other | Admitting: Family Medicine

## 2015-11-18 ENCOUNTER — Ambulatory Visit: Payer: Medicare Other | Admitting: Family Medicine

## 2015-12-11 ENCOUNTER — Encounter: Payer: Self-pay | Admitting: Family Medicine

## 2015-12-11 ENCOUNTER — Ambulatory Visit (INDEPENDENT_AMBULATORY_CARE_PROVIDER_SITE_OTHER): Payer: Medicare Other | Admitting: Family Medicine

## 2015-12-11 DIAGNOSIS — I10 Essential (primary) hypertension: Secondary | ICD-10-CM | POA: Diagnosis not present

## 2015-12-11 DIAGNOSIS — E118 Type 2 diabetes mellitus with unspecified complications: Secondary | ICD-10-CM | POA: Diagnosis present

## 2015-12-11 DIAGNOSIS — E785 Hyperlipidemia, unspecified: Secondary | ICD-10-CM

## 2015-12-11 MED ORDER — METFORMIN HCL 500 MG PO TABS
500.0000 mg | ORAL_TABLET | Freq: Two times a day (BID) | ORAL | 3 refills | Status: DC
Start: 2015-12-11 — End: 2016-05-08

## 2015-12-11 NOTE — Progress Notes (Signed)
    Subjective:  Randall Brown is a 65 y.o. male who presents to the [facility] today with a chief complaint of T2DM follow up.   HPI:  T2DM Currently on Januvia. Tolerating well without side effects. Occasionally checks his sugars. Usuually in the 140s-170s range. Has a few in the 200s. Low of 116. No polyuria or polydipsia. No palpitations, shakiness, or dizziness.  Hypertension BP Readings from Last 3 Encounters:  12/11/15 (!) 149/84  10/18/15 (!) 154/88  09/05/15 112/64   Home BP monitoring-Yes Compliant with medications-yes, without side effects ROS-Denies any CP, HA, SOB, blurry vision, LE edema, transient weakness, orthopnea, PND.  HLD Tolerating atorvastatin without side effects.   ROS: Per HPI  PMH: Smoking history reviewed.   Objective:  Physical Exam: BP (!) 149/84   Pulse 81   Temp 97.5 F (36.4 C) (Oral)   Wt 204 lb (92.5 kg)   SpO2 95%   BMI 33.95 kg/m   Gen: NAD, resting comfortably CV: RRR with no murmurs appreciated Pulm: NWOB, CTAB with no crackles, wheezes, or rhonchi GI: Obese, Normal bowel sounds present. Soft, Nontender, Nondistended. MSK: no edema, cyanosis, or clubbing noted Skin: warm, dry Neuro: grossly normal, moves all extremities Psych: Normal affect and thought content  Assessment/Plan:  Type 2 diabetes mellitus (HCC) Will start metformin today - have previously been avoiding due to concern over renal function, but last few Cr have been within normal limits. Continue [place]. Follow up in 1-2 months. Will recheck A1c at that time.   Essential hypertension At goal today. Continue norvasc, lisinopril, and metoprolol.   Hyperlipidemia Direct LDL 93 earlier this year. Tolerating atorvastatin. Will continue.   Katina Degree. Jimmey Ralph, MD [facility] Family Medicine Resident PGY-3 12/11/2015 4:48 PM

## 2015-12-11 NOTE — Patient Instructions (Signed)
We will start metformin today. This can cause you to have diarrhea for 1-2 weeks. If it lasts for longer, please let us know.  We will not make any other changes today.   Please come see me for a follow up in 2 months.  Take care,  Dr Jimmey Ralph

## 2015-12-11 NOTE — Assessment & Plan Note (Signed)
At goal today. Continue norvasc, lisinopril, and metoprolol.

## 2015-12-11 NOTE — Assessment & Plan Note (Signed)
Will start metformin today - have previously been avoiding due to concern over renal function, but last few Cr have been within normal limits. Continue [place]. Follow up in 1-2 months. Will recheck A1c at that time.

## 2015-12-11 NOTE — Assessment & Plan Note (Signed)
Direct LDL 93 earlier this year. Tolerating atorvastatin. Will continue.

## 2016-01-04 ENCOUNTER — Other Ambulatory Visit: Payer: Self-pay | Admitting: Family Medicine

## 2016-01-06 NOTE — Telephone Encounter (Signed)
Rx filled.  Randall Degreealeb M. Jimmey RalphParker, MD [facility] Resident PGY-3 01/06/2016 8:45 AM

## 2016-02-04 ENCOUNTER — Telehealth: Payer: Self-pay | Admitting: *Deleted

## 2016-02-04 MED ORDER — GLIPIZIDE 5 MG PO TABS: 5.0000 mg | ORAL_TABLET | Freq: Two times a day (BID) | ORAL | 3 refills | Status: DC

## 2016-02-04 NOTE — Telephone Encounter (Signed)
Patient is in the donut hole for [place]ia and it is now $500 for him.  His wife Arline AspCindy states that PCP mentioned at last OV that something cheaper could possibly be called in for him to take with his metformin.  Patient would like to go this route.  Will forward to MD. Burnard HawthorneJazmin Hartsell,CMA

## 2016-02-04 NOTE — Telephone Encounter (Signed)
Glipizide sent in. Will d/c januvia.   Katina Degreealeb M. Jimmey RalphParker, MD [facility] Resident PGY-3 02/04/2016 10:56 AM

## 2016-02-18 ENCOUNTER — Ambulatory Visit (INDEPENDENT_AMBULATORY_CARE_PROVIDER_SITE_OTHER): Payer: Medicare Other | Admitting: *Deleted

## 2016-02-18 DIAGNOSIS — Z111 Encounter for screening for respiratory tuberculosis: Secondary | ICD-10-CM | POA: Diagnosis present

## 2016-02-18 DIAGNOSIS — Z23 Encounter for immunization: Secondary | ICD-10-CM

## 2016-02-18 NOTE — Progress Notes (Signed)
     Patient presents for PPD placement for work Denies previous positive TB test  Denies known exposure to TB   Tuberculin skin test applied to left ventral forearm.  Patient aware that he needs to return in 48 hours for PPD reading   Flu vaccine administered today Patient will obtain TDaP at local pharmacy States unable to obtain Zostavax at this time due to Medicare donut hole. Fredderick SeveranceUCATTE, Soraiya Ahner L, RN

## 2016-02-19 ENCOUNTER — Ambulatory Visit (INDEPENDENT_AMBULATORY_CARE_PROVIDER_SITE_OTHER): Payer: Medicare Other | Admitting: Podiatry

## 2016-02-19 ENCOUNTER — Encounter: Payer: Self-pay | Admitting: Podiatry

## 2016-02-19 VITALS — BP 126/76 | HR 71 | Resp 16

## 2016-02-19 DIAGNOSIS — G2581 Restless legs syndrome: Secondary | ICD-10-CM

## 2016-02-19 DIAGNOSIS — E0843 Diabetes mellitus due to underlying condition with diabetic autonomic (poly)neuropathy: Secondary | ICD-10-CM

## 2016-02-19 DIAGNOSIS — M79609 Pain in unspecified limb: Secondary | ICD-10-CM

## 2016-02-19 DIAGNOSIS — B351 Tinea unguium: Secondary | ICD-10-CM

## 2016-02-19 DIAGNOSIS — L603 Nail dystrophy: Secondary | ICD-10-CM

## 2016-02-19 DIAGNOSIS — L608 Other nail disorders: Secondary | ICD-10-CM

## 2016-02-19 MED ORDER — PRAMIPEXOLE DIHYDROCHLORIDE 0.125 MG PO TABS: 0.1250 mg | ORAL_TABLET | Freq: Every evening | ORAL | 0 refills | Status: DC

## 2016-02-19 NOTE — Patient Instructions (Signed)
Diabetes and Foot Care Diabetes may cause you to have problems because of poor blood supply (circulation) to your feet and legs. This may cause the skin on your feet to become thinner, break easier, and heal more slowly. Your skin may become dry, and the skin may peel and crack. You may also have nerve damage in your legs and feet causing decreased feeling in them. You may not notice minor injuries to your feet that could lead to infections or more serious problems. Taking care of your feet is one of the most important things you can do for yourself.  HOME CARE INSTRUCTIONS  Wear shoes at all times, even in the house. Do not go barefoot. Bare feet are easily injured.  Check your feet daily for blisters, cuts, and redness. If you cannot see the bottom of your feet, use a mirror or ask someone for help.  Wash your feet with warm water (do not use hot water) and mild soap. Then pat your feet and the areas between your toes until they are completely dry. Do not soak your feet as this can dry your skin.  Apply a moisturizing lotion or petroleum jelly (that does not contain alcohol and is unscented) to the skin on your feet and to dry, brittle toenails. Do not apply lotion between your toes.  Trim your toenails straight across. Do not dig under them or around the cuticle. File the edges of your nails with an emery board or nail file.  Do not cut corns or calluses or try to remove them with medicine.  Wear clean socks or stockings every day. Make sure they are not too tight. Do not wear knee-high stockings since they may decrease blood flow to your legs.  Wear shoes that fit properly and have enough cushioning. To break in new shoes, wear them for just a few hours a day. This prevents you from injuring your feet. Always look in your shoes before you put them on to be sure there are no objects inside.  Do not cross your legs. This may decrease the blood flow to your feet.  If you find a minor scrape,  cut, or break in the skin on your feet, keep it and the skin around it clean and dry. These areas may be cleansed with mild soap and water. Do not cleanse the area with peroxide, alcohol, or iodine.  When you remove an adhesive bandage, be sure not to damage the skin around it.  If you have a wound, look at it several times a day to make sure it is healing.  Do not use heating pads or hot water bottles. They may burn your skin. If you have lost feeling in your feet or legs, you may not know it is happening until it is too late.  Make sure your health care provider performs a complete foot exam at least annually or more often if you have foot problems. Report any cuts, sores, or bruises to your health care provider immediately. SEEK MEDICAL CARE IF:   You have an injury that is not healing.  You have cuts or breaks in the skin.  You have an ingrown nail.  You notice redness on your legs or feet.  You feel burning or tingling in your legs or feet.  You have pain or cramps in your legs and feet.  Your legs or feet are numb.  Your feet always feel cold. SEEK IMMEDIATE MEDICAL CARE IF:   There is increasing redness,   swelling, or pain in or around a wound.  There is a red line that goes up your leg.  Pus is coming from a wound.  You develop a fever or as directed by your health care provider.  You notice a bad smell coming from an ulcer or wound.   This information is not intended to replace advice given to you by your health care provider. Make sure you discuss any questions you have with your health care provider.   Document Released: 04/24/2000 Document Revised: 12/28/2012 Document Reviewed: 10/04/2012 Elsevier Interactive Patient Education 2016 Elsevier Inc.  

## 2016-02-19 NOTE — Progress Notes (Signed)
   Subjective:    Patient ID: Randall Brown, male    DOB: 03/01/1951, [age] y.o.   MRN: [id number]  HPI    Review of Systems  Respiratory: Positive for shortness of breath.   Cardiovascular: Positive for leg swelling.  Gastrointestinal: Positive for diarrhea.  Genitourinary: Positive for difficulty urinating, frequency and urgency.  All other systems reviewed and are negative.      Objective:   Physical Exam        Assessment & Plan:

## 2016-02-20 ENCOUNTER — Encounter: Payer: Self-pay | Admitting: Family Medicine

## 2016-02-20 ENCOUNTER — Encounter: Payer: Self-pay | Admitting: *Deleted

## 2016-02-20 ENCOUNTER — Ambulatory Visit (INDEPENDENT_AMBULATORY_CARE_PROVIDER_SITE_OTHER): Payer: Medicare Other | Admitting: Family Medicine

## 2016-02-20 ENCOUNTER — Ambulatory Visit (INDEPENDENT_AMBULATORY_CARE_PROVIDER_SITE_OTHER): Payer: Medicare Other | Admitting: *Deleted

## 2016-02-20 VITALS — BP 139/76 | HR 74 | Temp 97.7°F | Ht 65.0 in | Wt 201.8 lb

## 2016-02-20 DIAGNOSIS — Z1159 Encounter for screening for other viral diseases: Secondary | ICD-10-CM

## 2016-02-20 DIAGNOSIS — E785 Hyperlipidemia, unspecified: Secondary | ICD-10-CM

## 2016-02-20 DIAGNOSIS — Z789 Other specified health status: Secondary | ICD-10-CM | POA: Diagnosis not present

## 2016-02-20 DIAGNOSIS — E118 Type 2 diabetes mellitus with unspecified complications: Secondary | ICD-10-CM

## 2016-02-20 DIAGNOSIS — Z111 Encounter for screening for respiratory tuberculosis: Secondary | ICD-10-CM

## 2016-02-20 DIAGNOSIS — I10 Essential (primary) hypertension: Secondary | ICD-10-CM

## 2016-02-20 LAB — BASIC METABOLIC PANEL WITH GFR
BUN: 25 mg/dL (ref 7–25)
CHLORIDE: 103 mmol/L (ref 98–110)
CO2: 21 mmol/L (ref 20–31)
CREATININE: 1.37 mg/dL — AB (ref 0.70–1.25)
Calcium: 9.1 mg/dL (ref 8.6–10.3)
GFR, Est African American: 62 mL/min (ref >=60)
GFR, Est Non African American: 53 mL/min — ABNORMAL LOW (ref >=60)
GLUCOSE: 86 mg/dL (ref 65–99)
POTASSIUM: 4.7 mmol/L (ref 3.5–5.3)
Sodium: 136 mmol/L (ref 135–146)

## 2016-02-20 LAB — POCT GLYCOSYLATED HEMOGLOBIN (HGB A1C): HEMOGLOBIN A1C: 7.1

## 2016-02-20 LAB — TB SKIN TEST
INDURATION: 0 mm
TB Skin Test: NEGATIVE

## 2016-02-20 MED ORDER — DTAP-IPV VACCINE IM SUSP: 0.5000 mL | Freq: Once | INTRAMUSCULAR | 0 refills | Status: AC

## 2016-02-20 NOTE — Progress Notes (Signed)
   PPD Reading Note PPD read and results entered in EpicCare. Result: 0 mm induration. Interpretation: Negative If test not read within 48-72 hours of initial placement, patient advised to repeat in other arm 1-3 weeks after this test. Allergic reaction: no  Martin, Tamika L, RN  

## 2016-02-20 NOTE — Assessment & Plan Note (Signed)
LDL 93 earlier this year. Continue atorvastatin.

## 2016-02-20 NOTE — Assessment & Plan Note (Signed)
A1c slightly above goal today. Patient very intersted in making lifestyle modifications. Will refer to nutritionist today. Defered increasing medications. Follow up in 3 months and recheck A1c at that time. May need increased pharmacotherapy if A1c not at goal.

## 2016-02-20 NOTE — Assessment & Plan Note (Signed)
At goal. Continue norvasc, lisinopril, and metoprolol.

## 2016-02-20 NOTE — Progress Notes (Signed)
   Subjective:  Randall Brown is a 65 y.o. male who presents to the [facility] today with a chief complaint of T2DM follow up.   HPI:  T2DM Currently taking glipizide and metformin. Was not able to afford [place]. He is having some mild diarrhea with the metformin, but is otherwise tolerating well. HE has been trying to cut back on his carb intake and has been interested in eating healthier. Wants to see a nutritionist.   Hypertension BP Readings from Last 3 Encounters:  02/20/16 139/76  02/19/16 126/76  12/11/15 (!) 149/84   Home BP monitoring-Yes Compliant with medications-yes, without side effects ROS-Denies any CP, HA, SOB, blurry vision, LE edema, transient weakness, orthopnea, PND.   HLD Tolerating atovastatin without side effects.   Work Forms. Patient needs documentation that his Tetanus, Hep B, and MMR vaccines are up to date.   ROS: Per HPI  PMH: Smoking history reviewed.    Objective:  Physical Exam: BP 139/76   Pulse 74   Temp 97.7 F (36.5 C) (Oral)   Ht _0  (1.651 m)   Wt 201 lb 12.8 oz (91.5 kg)   BMI 33.58 kg/m   Gen: NAD, resting comfortably CV: RRR with no murmurs appreciated Pulm: NWOB, CTAB with no crackles, wheezes, or rhonchi GI: Obese, Normal bowel sounds present. Soft, Nontender, Nondistended. MSK: no edema, cyanosis, or clubbing noted Skin: warm, dry Neuro: grossly normal, moves all extremities Psych: Normal affect and thought content  Assessment/Plan:  Type 2 diabetes mellitus (HCC) A1c slightly above goal today. Patient very intersted in making lifestyle modifications. Will refer to nutritionist today. Defered increasing medications. Follow up in 3 months and recheck A1c at that time. May need increased pharmacotherapy if A1c not at goal.   Hyperlipidemia LDL 93 earlier this year. Continue atorvastatin.   Essential hypertension At goal. Continue norvasc, lisinopril, and metoprolol.   Vaccinations Gave Rx for tdap today. Will check  Hep B and MMR titers as patient does not remember if he has been vaccinated or not.   Algis Greenhouse. Jerline Pain, [facility] Resident PGY-3 02/20/2016 11:50 AM

## 2016-02-20 NOTE — Patient Instructions (Signed)
We will check to see if you need your MMR and hepatitis B vaccines today.  Please go to the pharmacy to get your tetanus shot.  Please see Dr Jenne Campus.  Come back to see me in 3 months, or sooner if you need anything else.  Take care,  Dr Jerline Pain

## 2016-02-21 ENCOUNTER — Telehealth: Payer: Self-pay | Admitting: Family Medicine

## 2016-02-21 LAB — MEASLES/MUMPS/RUBELLA IMMUNITY
Mumps IgG: 72.9 AU/mL — ABNORMAL HIGH (ref <9.00)
Rubella: 23.3 Index — ABNORMAL HIGH (ref <0.90)

## 2016-02-21 LAB — HEPATITIS B SURFACE ANTIBODY, QUANTITATIVE

## 2016-02-21 NOTE — Telephone Encounter (Signed)
Called patient to inform him of results. Left detailed message with wife. Informed her that patient had MMR in the past, but that he did not show any signs of having a hep B vaccination. Told her that he would need to go to the health department for a complete vaccination series. She voiced understanding and had no further questions.  Randall Brown. Jerline Pain, [facility] Resident PGY-3 02/21/2016 3:51 PM

## 2016-02-24 ENCOUNTER — Other Ambulatory Visit: Payer: Self-pay | Admitting: *Deleted

## 2016-02-24 NOTE — Telephone Encounter (Signed)
Patient states that refill request for lisinopril was sent to his old PCP and he will need this script in a few weeks.  Also I changed the script for tdap.  (kinrix is what was ordered).  Sully Dyment,CMA

## 2016-02-24 NOTE — Progress Notes (Signed)
Patient ID: Randall Brown, male   DOB: 12/13/1950, 65 y.o.   MRN: [id number] SUBJECTIVE Patient with a history of diabetes mellitus presents to office today complaining of elongated, thickened nails. Pain while ambulating in shoes. Patient is unable to trim their own nails. Patient also complains of restless legs at night.  No Known Allergies  OBJECTIVE General Patient is awake, alert, and oriented x 3 and in no acute distress. Derm Skin is dry and supple bilateral. Negative open lesions or macerations. Remaining integument unremarkable. Nails are tender, long, thickened and dystrophic with subungual debris, consistent with onychomycosis, 1-5 bilateral. No signs of infection noted. Vasc  DP and PT pedal pulses palpable bilaterally. Temperature gradient within normal limits.  Neuro Epicritic and protective threshold sensation diminished bilaterally.  Musculoskeletal Exam No symptomatic pedal deformities noted bilateral. Muscular strength within normal limits.  ASSESSMENT 1. Diabetes Mellitus w/ peripheral neuropathy 2. Onychomycosis of nail due to dermatophyte bilateral 3. Pain in foot bilateral 4. Restless leg syndrome bilateral lower extremities  PLAN OF CARE 1. Patient evaluated today. 2. Instructed to maintain good pedal hygiene and foot care. Stressed importance of controlling blood sugar.  3. Mechanical debridement of nails 1-5 bilaterally performed using a nail nipper. Filed with dremel without incident.  4. Prescription for Mirapex 0.125 mg dispensed 5. Paperwork for diabetic shoe authorization initiated 6. Return to clinic in 1 yr   Felecia ShellingBrent M Evans, DPM

## 2016-02-25 MED ORDER — LISINOPRIL 20 MG PO TABS: 20.0000 mg | ORAL_TABLET | Freq: Every day | ORAL | 11 refills | Status: DC

## 2016-02-25 MED ORDER — TETANUS-DIPHTH-ACELL PERTUSSIS 5-2.5-18.5 LF-MCG/0.5 IM SUSP: 0.5000 mL | Freq: Once | INTRAMUSCULAR | 0 refills | Status: AC

## 2016-02-27 ENCOUNTER — Telehealth: Payer: Self-pay | Admitting: Family Medicine

## 2016-02-27 NOTE — Telephone Encounter (Signed)
Clinical info completed on new employment form.  Place form in Dr. Lavone NeriParker's box for completion.  Feliz BeamHARTSELL,  Randall Brown, CMA

## 2016-02-27 NOTE — Telephone Encounter (Signed)
Form completed and placed on Tamika's desk.  Katina Degreealeb M. Jimmey RalphParker, MD [facility] Resident PGY-3 02/27/2016 2:32 PM

## 2016-02-27 NOTE — Telephone Encounter (Signed)
[employer] form (new Job) dropped off for at front desk for completion.  Verified that patient section of form has been completed.  Last DOS with PCP was 02-20-2016.  Placed form in team folder to be completed by clinical staff.  Rosa A Antony Odeaelgado Martin

## 2016-02-27 NOTE — Telephone Encounter (Signed)
Patient informed that form is complete and ready for pickkup. Clovis PuMartin, Jadeyn Hargett L, RN

## 2016-03-02 ENCOUNTER — Encounter: Payer: Self-pay | Admitting: Family Medicine

## 2016-03-03 ENCOUNTER — Encounter: Payer: Self-pay | Admitting: Family Medicine

## 2016-03-18 ENCOUNTER — Other Ambulatory Visit: Payer: Self-pay | Admitting: Podiatry

## 2016-03-19 NOTE — Telephone Encounter (Signed)
Pt needs an appt prior to future refills. 

## 2016-03-24 ENCOUNTER — Telehealth: Payer: Self-pay | Admitting: Family Medicine

## 2016-03-24 ENCOUNTER — Ambulatory Visit (INDEPENDENT_AMBULATORY_CARE_PROVIDER_SITE_OTHER): Payer: Medicare Other | Admitting: Family Medicine

## 2016-03-24 ENCOUNTER — Encounter: Payer: Self-pay | Admitting: Family Medicine

## 2016-03-24 DIAGNOSIS — G2581 Restless legs syndrome: Secondary | ICD-10-CM

## 2016-03-24 DIAGNOSIS — E118 Type 2 diabetes mellitus with unspecified complications: Secondary | ICD-10-CM | POA: Diagnosis not present

## 2016-03-24 DIAGNOSIS — Z79899 Other long term (current) drug therapy: Secondary | ICD-10-CM

## 2016-03-24 NOTE — Telephone Encounter (Signed)
-----   Message from Linna DarnerJean C Sykes, RD sent at 03/24/2016 10:52 AM EST ----- Regarding: Nutrition Clinic December Damiana Berrian,  I saw Mr. Campus today for his DM.  He's scheduled to see you in Nutrition clinic Dec 14.  Gave him a lot of info, and he had LOTS of Qs today.  In doing the med rec, we got into discussion of his RLS for which he takes Mirapex, so I asked if he'd had a ferritin measured.  No sign of it in his chart.  Can you put in an order for this, and have him get it done either before we meet 12/14 (preferable) or after his appt that day? Thanks,  [employer] MartinJeannie

## 2016-03-24 NOTE — Progress Notes (Signed)
Medical Nutrition Therapy:  Appt start time: 0900 end time:  1000.  Assessment:  Primary concerns today: Weight management and Blood sugar control.  [name] said he feels pretty confused about how to eat for best BG control, and would like some help in understanding this.  He likes a pretty wide variety of foods, and likes to cook, but also had some strong beliefs about what he finds acceptable, e.g., said he's tried whole wheat bread many times, but just doesn't like it.  Also said he doesn't want to give up his Reese's Cups, although we talked about recent eye problems he has had, and he is concerned about preserving vision as well as kidney function, so knows the importance of BG control for these reasons.    Learning Readiness: Ready  Usual eating pattern includes 2-3 meals and 2 snacks per day. Frequent foods and beverages include 4 oz soda, 24 oz 1% milk, >32 oz water, 4 c coffee w/ stevia, burgers, canned soup, 2 Reese's Cups most days, chx, pork.  Avoided foods include hummus, raw veg's, whole wheat bread (disliked).   Usual physical activity includes none; hip pain precludes walking.    FBG have been running around 120s (this AM 157), which he understands is probably from the Twinkies and Reese's Cups last night.    24-hr recall: (Up at 8 AM) B (8 AM)-   ~5 c coffee, stevia Snk ( AM)-   --- L (11 AM)-  2 scrmbled eggs in butter, 2 white toast, 1 T butter, 1 t jelly, 12 oz 1% milk Snk (2 PM)-  8 oz Pepsi, 1 c Doritos D (6:30 PM)-  6-8 oz pork roast, 1/2 c corn, 1 c mashed potatoes, 2 bread, 1 T butter, 1/2 c cot chs, 1 T crushed pineapple, 16 oz 1% milk Snk (7 PM)-  2 Twinkies,  Snk (9 PM)-  2 small Reese's Cups Typical day? Yes.    Progress Towards Goal(s):  In progress.   Nutritional Diagnosis:  NB-1.1 Food and nutrition-related knowledge deficit As related to glycemic control.  As evidenced by A1C of 7.1.    Intervention:  Nutrition education.   Handouts given during  visit include:  AVS  Demonstrated degree of understanding via:  Teach Back  Barriers to learning/adherence to lifestyle change: Long-standing dietary habits as well as difficulty exercising b/c of hip pain.    Monitoring/Evaluation:  Dietary intake, exercise, FBG, and body weight in 4 week(s) in Nutrition Clinic with PCP Dr. Jimmey RalphParker.

## 2016-03-24 NOTE — Patient Instructions (Addendum)
-   Talk with Dr. Jimmey RalphParker about testing your ferritin levels.  If lower than 75 ng/mL, consider taking an iron supplement (along with at least 200 mg vitamin C) on an empty stomach to get up to at least 75 ng/mL or even higher.   - You may want to see one of the physicians (Dr. Jessy Otoraper, Neal, or Fields) at [facility] or Dr. Terrilee FilesZach Smith at [facility] for your hip pain.    - Keep in mind that TASTE PREFERENCES ARE LEARNED.  This means that it will get easier to choose foods you know are good for you if you are exposed to them enough.     Diet Recommendations for Diabetes  Carbohydrates are made up of starch, sugar, and fiber.  Of these three types of carb, only starch and sugar provide calories and raise blood sugar.    Starchy (carb) foods: Bread, rice, pasta, potatoes, corn, cereal, grits, crackers, bagels, muffins, all baked goods.  (Fruits, milk, and yogurt also have carbohydrate, but most of these foods will not spike your blood sugar as the starchy foods will.)  A few fruits do cause high blood sugars; use small portions of bananas (limit to 1/2 at a time), grapes, watermelon, oranges, and most tropical fruits.    Protein foods: Meat, fish, poultry, eggs, dairy foods, and beans such as pinto and kidney beans (beans also provide carbohydrate).   1. Eat at least REAL 3 meals and 1-2 snacks per day. Never go more than 4-5 hours while awake without eating. Eat breakfast within the first hour of getting up.    - A REAL meal includes a source of protein, starch, and veg's and/or fruit.  (The fruit in this case is for breakfast.) 2. Limit starchy foods to TWO per meal and ONE per snack. ONE portion of a starchy  food is equal to the following:   - ONE slice of bread (or its equivalent, such as half of a hamburger bun).   - 1/2 cup of a "scoopable" starchy food such as potatoes or rice.   - 15 grams of total carbohydrate as shown on food label.  3. Include at every meal: a  protein food, a carb food, and vegetables and/or fruit.   - Obtain twice the volume of veg's as protein or carbohydrate foods for both lunch and dinner.   - Fresh or frozen veg's are best.   - Keep frozen veg's on hand for a quick vegetable serving.   - More vegetables in your diet will also supply potassium, which will help your blood pressure.    - Eating in the ways suggested above will help you with appetite control.    - If you use canned soup, first microwave fresh or frozen veg's, add the soup, and reheat.    - Checking fasting blood glucose levels:  If a reading is especially high or low, IMMEDIATELY write down what you last ate, how much, and what time.

## 2016-03-27 ENCOUNTER — Other Ambulatory Visit: Payer: Self-pay | Admitting: Family Medicine

## 2016-03-27 DIAGNOSIS — G2581 Restless legs syndrome: Secondary | ICD-10-CM

## 2016-04-07 ENCOUNTER — Other Ambulatory Visit: Payer: Self-pay | Admitting: Family Medicine

## 2016-04-08 ENCOUNTER — Telehealth: Payer: Self-pay | Admitting: *Deleted

## 2016-04-08 NOTE — Telephone Encounter (Signed)
Pt was trying to get his diabetic shoes before the end of the year. He has not heard anything. Just wondering what is going on.

## 2016-04-10 ENCOUNTER — Telehealth: Payer: Self-pay | Admitting: Family Medicine

## 2016-04-10 ENCOUNTER — Encounter: Payer: Self-pay | Admitting: *Deleted

## 2016-04-10 NOTE — Telephone Encounter (Signed)
Pt is calling to check the status of his diabetic shoes. jw

## 2016-04-10 NOTE — Telephone Encounter (Signed)
I haven't received any paperwork or documentation for diabetic shoes. He should check with his podiatrist's office.  Katina Degreealeb M. Jimmey RalphParker, MD [facility] Resident PGY-3 04/10/2016 10:45 AM

## 2016-04-10 NOTE — Telephone Encounter (Signed)
Mychart message sent to patient. Randall Brown,CMA  

## 2016-04-13 ENCOUNTER — Telehealth: Payer: Self-pay | Admitting: Family Medicine

## 2016-04-13 NOTE — Telephone Encounter (Signed)
Kathlene NovemberMike asked for Jasmin to call him about his wife cindy. He told me it was about cindy after I had started the note

## 2016-04-14 NOTE — Telephone Encounter (Signed)
Spoke to wife. Jazmin Hartsell,CMA

## 2016-04-18 ENCOUNTER — Other Ambulatory Visit: Payer: Self-pay | Admitting: Family Medicine

## 2016-04-23 ENCOUNTER — Ambulatory Visit (INDEPENDENT_AMBULATORY_CARE_PROVIDER_SITE_OTHER): Payer: Medicare Other | Admitting: Family Medicine

## 2016-04-23 VITALS — Ht 65.0 in | Wt 201.6 lb

## 2016-04-23 DIAGNOSIS — E118 Type 2 diabetes mellitus with unspecified complications: Secondary | ICD-10-CM

## 2016-04-23 NOTE — Progress Notes (Signed)
Assessment:  Primary concerns today: Weight management and blood sugar control.  Patient presents for follow up visit for nutrition visit from one month ago. Last visit, discussed diet recommendations for diabetes including eating a real meal 3 times a day with 1-2 snacks and limiting starchy foods to two per meal.   Over the last month patient thinks that he is doing better, though still admits that he has a lot of work left to do.  He tried putting vegetables in his pasta, but didn't like it. He has been putting vegetables on other foods and has been trying to eat a lot of salad. He has stopped buying fruit for 2 reasons - one being that he doesn't like the taste of many fruits and the second being that it goes bad quickly and he has to end up wasting them. He has been trying to cut back on his Pepsi consumption - is now only drinking about 8oz a day.   Learning Readiness:   Not ready  Contemplating  Ready  Change in progress  Usual eating pattern includes 2-3 meals and 2 snacks per day. Frequent foods and beverages include 4 oz soda, 24 oz 1% milk, >32 oz water, 4 c coffee w/ stevia, burgers, canned soup, 2 Reese's Cups most days, chx, pork.  Avoided foods include hummus, raw veg's, whole wheat bread (disliked).   Usual physical activity includes none; hip pain precludes walking.    24-hr recall: (Up at 6:30 AM) B (6:30-8:30 AM)-   2 cups of coffee with Stevia Snk ( AM)-    4 cookies while at work L ( 1:30 PM)-   4oz chicken breast, cup and a half of mashed potatoes, 2 cups of stuffing, cup of canned peas, 12oz milk Snk ( PM)-   D ( 6PM)-    2 cups Fettuccini alfredo, salad, 1 breadstick, 1 8oz pepsi Snk ( 10 PM)-   3 cookies, Ham and cheese sandwich on a bn Typical day? Yes.  Usually eats 2 meals a day.   Plan/New goals for this visit: 1. Eat breakfast everyday.  - You can try making egg salad sandwiches the night before to take on the go.  - Try eating yogurt or cottage cheese   2. Aim to have some protein with every meal.   - For example: tuna fish, cheese, egg salad, cottage cheese 3. Plan ahead for tomorrow's snacks  Handouts given during visit include:  AVS  Demonstrated degree of understanding via:  Teach Back  Barriers to learning/adherence to lifestyle change: Longstanding dietary habits.

## 2016-04-23 NOTE — Patient Instructions (Addendum)
  1. Eat breakfast everyday.  - You can try making egg salad sandwiches the night before to take on the go.  - Try eating yogurt or cottage cheese  2. Aim to have some protein with every meal.   - For example: tuna fish, cheese, egg salad, cottage cheese 3. Plan ahead for tomorrow's snacks  Diet Recommendations for Diabetes  Carbohydrates are made up of starch, sugar, and fiber.  Of these three types of carb, only starch and sugar provide calories and raise blood sugar.    Starchy (carb) foods: Bread, rice, pasta, potatoes, corn, cereal, grits, crackers, bagels, muffins, all baked goods.  (Fruits, milk, and yogurt also have carbohydrate, but most of these foods will not spike your blood sugar as the starchy foods will.)  A few fruits do cause high blood sugars; use small portions of bananas (limit to 1/2 at a time), grapes, watermelon, oranges, and most tropical fruits.    Protein foods: Meat, fish, poultry, eggs, dairy foods, and beans such as pinto and kidney beans (beans also provide carbohydrate).   1. Eat at least REAL 3 meals and 1-2 snacks per day. Never go more than 4-5 hours while awake without eating. Eat breakfast within the first hour of getting up.               - A REAL meal includes a source of protein, starch, and veg's and/or fruit.  (The fruit in this case is for breakfast.) 2. Limit starchy foods to TWO per meal and ONE per snack. ONE portion of a starchy  food is equal to the following:              - ONE slice of bread (or its equivalent, such as half of a hamburger bun).              - 1/2 cup of a "scoopable" starchy food such as potatoes or rice.              - 15 grams of total carbohydrate as shown on food label.  3. Include at every meal: a protein food, a carb food, and vegetables and/or fruit.              - Obtain twice the volume of veg's as protein or carbohydrate foods for both lunch and dinner.              - Fresh or frozen veg's are best.              -  Keep frozen veg's on hand for a quick vegetable serving.              - More vegetables in your diet will also supply potassium, which will help your blood pressure.    - Eating in the ways suggested above will help you with appetite control.    - If you use canned soup, first microwave fresh or frozen veg's, add the soup, and reheat.    - Checking fasting blood glucose levels:  If a reading is especially high or low, IMMEDIATELY write down what you last ate, how much, and what time.

## 2016-05-06 ENCOUNTER — Encounter: Payer: Self-pay | Admitting: Family Medicine

## 2016-05-07 ENCOUNTER — Telehealth: Payer: Self-pay | Admitting: Family Medicine

## 2016-05-07 MED ORDER — TAMSULOSIN HCL 0.4 MG PO CAPS: 0.8000 mg | ORAL_CAPSULE | Freq: Every day | ORAL | 3 refills | Status: DC

## 2016-05-07 NOTE — Telephone Encounter (Signed)
Rx filled.  Katina Degreealeb M. Jimmey RalphParker, MD [facility] Resident PGY-3 05/07/2016 8:30 AM

## 2016-05-08 ENCOUNTER — Other Ambulatory Visit: Payer: Self-pay | Admitting: *Deleted

## 2016-05-08 MED ORDER — METOPROLOL TARTRATE 50 MG PO TABS: 50.0000 mg | ORAL_TABLET | Freq: Two times a day (BID) | ORAL | 11 refills | Status: DC

## 2016-05-08 MED ORDER — GLIPIZIDE 5 MG PO TABS: 5.0000 mg | ORAL_TABLET | Freq: Two times a day (BID) | ORAL | 3 refills | Status: DC

## 2016-05-08 MED ORDER — ATORVASTATIN CALCIUM 40 MG PO TABS: 40.0000 mg | ORAL_TABLET | Freq: Every day | ORAL | 11 refills | Status: DC

## 2016-05-08 MED ORDER — METFORMIN HCL 500 MG PO TABS: 500.0000 mg | ORAL_TABLET | Freq: Two times a day (BID) | ORAL | 3 refills | Status: DC

## 2016-05-08 MED ORDER — AMLODIPINE BESYLATE 10 MG PO TABS: 10.0000 mg | ORAL_TABLET | Freq: Every day | ORAL | 11 refills | Status: DC

## 2016-05-08 MED ORDER — LISINOPRIL 20 MG PO TABS: 20.0000 mg | ORAL_TABLET | Freq: Every day | ORAL | 11 refills | Status: AC

## 2016-05-08 MED ORDER — PRAMIPEXOLE DIHYDROCHLORIDE 0.125 MG PO TABS
ORAL_TABLET | ORAL | 1 refills | Status: DC
Start: 2016-05-08 — End: 2016-08-14

## 2016-05-08 NOTE — Telephone Encounter (Signed)
Patient is now using a mail order pharmacy and would like these scripts for 90 days at a time. Keelen Quevedo,CMA

## 2016-05-11 ENCOUNTER — Encounter (HOSPITAL_COMMUNITY): Payer: Self-pay | Admitting: Emergency Medicine

## 2016-05-11 ENCOUNTER — Ambulatory Visit (HOSPITAL_COMMUNITY)
Admission: EM | Admit: 2016-05-11 | Discharge: 2016-05-11 | Disposition: A | Discharge status: Home / Self Care | Payer: Medicare Other | Attending: Emergency Medicine | Admitting: Emergency Medicine

## 2016-05-11 DIAGNOSIS — T148XXA Other injury of unspecified body region, initial encounter: Secondary | ICD-10-CM

## 2016-05-11 HISTORY — DX: Essential (primary) hypertension: I10

## 2016-05-11 HISTORY — DX: Type 2 diabetes mellitus without complications: E11.9

## 2016-05-11 MED ORDER — TRAMADOL HCL 50 MG PO TABS: 50.0000 mg | ORAL_TABLET | Freq: Four times a day (QID) | ORAL | 0 refills | Status: DC | PRN

## 2016-05-11 NOTE — ED Triage Notes (Signed)
PT stepped in a hole and injured right lower leg.

## 2016-05-11 NOTE — ED Provider Notes (Signed)
[place]    CSN: [id number] Arrival date & time: 05/11/16  1940     History   Chief Complaint Chief Complaint  Patient presents with  . Leg Pain    HPI Randall Brown is a 66 y.o. male.   HPI He is a 66 year old man here for evaluation of right calf pain. Earlier today, he was doing some work and he stepped in a hole on the curb. He wrenched his foot and has had pain in the posterior lateral ankle since. He is able to bear weight on it, but it is uncomfortable. He states he is fine with straight up-and-down movements, but inverting the foot is painful. Denies pain of the ankle joint or knee joint. He thinks there is a little lump, but denies any swelling. He has tried Tylenol with minimal improvement. He cannot take NSAIDs due to kidney issues.  Past Medical History:  Diagnosis Date  . Diabetes mellitus without complication ([place])   . Hypertension     Patient Active Problem List   Diagnosis Date Noted  . Dyspnea 10/18/2015  . Glaucoma 09/05/2015  . Left inguinal pain 08/19/2015  . Excessive daytime sleepiness 07/22/2015  . Type 2 diabetes mellitus ([place]) 01/21/2015  . Hyperlipidemia 01/21/2015  . Left arm pain 01/21/2015  . Essential hypertension 10/22/2014  . BPH (benign prostatic hyperplasia) 10/22/2014  . Hypomagnesemia 10/22/2014  . Low serum vitamin D 10/22/2014  . Cataracts, both eyes 10/22/2014  . Seasonal allergies 10/22/2014  . GERD (gastroesophageal reflux disease) 10/22/2014  . Diverticulitis of colon 10/22/2014  . Elevated serum creatinine 10/22/2014  . Elbow pain 10/22/2014  . Erectile dysfunction 10/22/2014    History reviewed. No pertinent surgical history.     Home Medications    Prior to Admission medications   Medication Sig Start Date End Date Taking? Authorizing Provider  amLODipine (NORVASC) 10 MG tablet Take 1 tablet (10 mg total) by mouth daily. 05/08/16   Vivi Barrack, MD  aspirin 81 MG tablet Take 81 mg by mouth  daily.    Historical Provider, MD  atorvastatin (LIPITOR) 40 MG tablet Take 1 tablet (40 mg total) by mouth daily. 05/08/16   Vivi Barrack, MD  Blood Glucose Monitoring Suppl (BAYER CONTOUR NEXT MONITOR) w/Device KIT Use daily as needed to check blood sugar. 10/30/15   Vivi Barrack, MD  chlorpheniramine (CHLOR-TRIMETON) 2 MG/5ML syrup Take 2 mg by mouth every 4 (four) hours as needed for allergies.    Historical Provider, MD  Cholecalciferol (VITAMIN D-3) 1000 units CAPS Take by mouth.    Historical Provider, MD  CycloSPORINE (RESTASIS OP) Apply to eye.    Historical Provider, MD  glipiZIDE (GLUCOTROL) 5 MG tablet Take 1 tablet (5 mg total) by mouth 2 (two) times daily before a meal. 05/08/16   Vivi Barrack, MD  glucose blood (BAYER CONTOUR NEXT TEST) test strip Use daily as needed to check blood sugar. 10/30/15   Vivi Barrack, MD  lisinopril (PRINIVIL,ZESTRIL) 20 MG tablet Take 1 tablet (20 mg total) by mouth daily. 05/08/16   Vivi Barrack, MD  Magnesium 250 MG TABS Take by mouth.    Historical Provider, MD  metFORMIN (GLUCOPHAGE) 500 MG tablet Take 1 tablet (500 mg total) by mouth 2 (two) times daily with a meal. 05/08/16   Vivi Barrack, MD  metoprolol (LOPRESSOR) 50 MG tablet Take 1 tablet (50 mg total) by mouth 2 (two) times daily. 05/08/16   Vivi Barrack, MD  pramipexole (MIRAPEX) 0.125 MG tablet TAKE 1 TABLET(0.125 MG) BY MOUTH EVERY EVENING 05/08/16   Vivi Barrack, MD  ranitidine (ZANTAC) 150 MG tablet Take 1 tablet (150 mg total) by mouth 2 (two) times daily. 07/22/15   Vivi Barrack, MD  tamsulosin (FLOMAX) 0.4 MG CAPS capsule Take 2 capsules (0.8 mg total) by mouth daily. 05/07/16   Vivi Barrack, MD  traMADol (ULTRAM) 50 MG tablet Take 1 tablet (50 mg total) by mouth every 6 (six) hours as needed. 05/11/16   Melony Overly, MD  [facility] THIN LANCETS MISC Use daily as needed to check blood sugar. 10/30/15   Vivi Barrack, MD    Family History No family history on  file.  Social History Social History  Substance Use Topics  . Smoking status: Former Smoker    Quit date: 10/22/2006  . Smokeless tobacco: Never Used  . Alcohol use 0.0 oz/week     Comment: occasionally     Allergies   Patient has no known allergies.   Review of Systems Review of Systems As in history of present illness  Physical Exam Triage Vital Signs ED Triage Vitals  Enc Vitals Group     BP      Pulse      Resp      Temp      Temp src      SpO2      Weight      Height      Head Circumference      Peak Flow      Pain Score      Pain Loc      Pain Edu?      Excl. in [place]?    No data found.   Updated Vital Signs BP 172/78 (BP Location: Right Arm)   Pulse 86   Temp 98 F (36.7 C) (Oral)   Resp 20   Ht _0  (1.651 m)   Wt 200 lb (90.7 kg)   SpO2 96%   BMI 33.28 kg/m   Visual Acuity Right Eye Distance:   Left Eye Distance:   Bilateral Distance:    Right Eye Near:   Left Eye Near:    Bilateral Near:     Physical Exam  Constitutional: He is oriented to person, place, and time. He appears well-developed and well-nourished. No distress.  Cardiovascular: Normal rate.   Pulmonary/Chest: Effort normal.  Musculoskeletal:  Right leg: No erythema or edema. He has full active range of motion of the ankle. Achilles tendon is intact and nontender. He is tender just lateral to the Achilles tendon over the peroneal muscle bellies. 5 out of 5 strength in dorsiflexion and plantar flexion.  Neurological: He is alert and oriented to person, place, and time.     UC Treatments / Results  Labs (all labs ordered are listed, but only abnormal results are displayed) Labs Reviewed - No data to display  EKG  EKG Interpretation None       Radiology No results found.  Procedures Procedures (including critical care time)  Medications Ordered in UC Medications - No data to display   Initial Impression / Assessment and Plan / UC Course  I have reviewed the  triage vital signs and the nursing notes.  Pertinent labs & imaging results that were available during my care of the patient were reviewed by me and considered in my medical decision making (see chart for details).  Clinical Course     Likely  small muscle strain or tear of the distal peroneal muscles. Ace bandage applied. Rest, ice, elevation. Tylenol as needed for pain. Prescription for tramadol provided. Work note given. Follow-up with PCP if not improving in 1 week.  Final Clinical Impressions(s) / UC Diagnoses   Final diagnoses:  Muscle strain    New Prescriptions New Prescriptions   TRAMADOL (ULTRAM) 50 MG TABLET    Take 1 tablet (50 mg total) by mouth every 6 (six) hours as needed.     Melony Overly, MD 05/11/16 2023

## 2016-05-11 NOTE — Discharge Instructions (Signed)
You likely have a small tear in the muscle of your leg. You don't have any bony tenderness to suggest a fracture. Keep it wrapped with Ace wrap for the next week. Rest and elevate it as much as you can. Apply ice at least 3 times a day for 20 minutes. Take Tylenol as needed for pain. If you need something stronger, use the tramadol. I do not want you standing for prolonged periods at work. If you have to stand, you'll need to wear your boot. Follow-up with your PCP if not improving in 1 week.

## 2016-05-14 ENCOUNTER — Telehealth: Payer: Self-pay | Admitting: Family Medicine

## 2016-05-14 ENCOUNTER — Encounter: Payer: Self-pay | Admitting: Family Medicine

## 2016-05-14 MED ORDER — METOPROLOL SUCCINATE ER 50 MG PO TB24: 50.0000 mg | ORAL_TABLET | Freq: Every day | ORAL | 3 refills | Status: DC

## 2016-05-14 NOTE — Telephone Encounter (Signed)
Patient is aware of this and also his new directions. Jazmin Hartsell,CMA

## 2016-05-14 NOTE — Telephone Encounter (Signed)
Same encounter sent into to dr. Jimmey RalphParker through [facility].

## 2016-05-14 NOTE — Telephone Encounter (Signed)
Will send in succinate form.  Katina Degreealeb M. Jimmey RalphParker, MD [facility] Resident PGY-3 05/14/2016 3:24 PM

## 2016-05-14 NOTE — Telephone Encounter (Signed)
Pt is calling because the new year and new medication plan is stating that his Metoprolol is no longer covered by his insurance. He would like us to find out from the pharmacy if that is truly correct and if so what can be used in place of that medication. jw

## 2016-05-21 ENCOUNTER — Ambulatory Visit: Payer: Medicare Other | Admitting: *Deleted

## 2016-05-21 DIAGNOSIS — E0843 Diabetes mellitus due to underlying condition with diabetic autonomic (poly)neuropathy: Secondary | ICD-10-CM

## 2016-05-21 NOTE — Progress Notes (Signed)
Patient ID: Randall BitterMichael Brown, male   DOB: 01/25/1951, 66 y.o.   MRN: [id number]   Patient presents to be scanned and measured for diabetic shoes and inserts.

## 2016-05-25 ENCOUNTER — Ambulatory Visit (INDEPENDENT_AMBULATORY_CARE_PROVIDER_SITE_OTHER): Payer: Medicare Other | Admitting: Family Medicine

## 2016-05-25 ENCOUNTER — Encounter: Payer: Self-pay | Admitting: Family Medicine

## 2016-05-25 DIAGNOSIS — E118 Type 2 diabetes mellitus with unspecified complications: Secondary | ICD-10-CM

## 2016-05-25 NOTE — Patient Instructions (Addendum)
-   Complete the blood glucose form provided today, and bring to follow-up appt.   If a reading is especially high or low, IMMEDIATELY write down what you last ate, how much, and what time.    - Do whatever you can to make sure you eat 3 real meals and 1-2 snacks daily.    - This means planning ahead for times when you know your schedule is going to be tight.    Reiterated from Nov 14: 1. Eat at least REAL 3 meals and 1-2 snacks per day. Never go more than 4-5 hours while awake without eating. Eat breakfast within the first hour of getting up.               - A REAL meal includes a source of protein, starch, and veg's and/or fruit.  (The fruit in this case is for breakfast.)  - OR: Would you serve this to a guest in your home, and call it a meal? 2. Limit starchy foods to TWO per meal and ONE per snack. ONE portion of a starchy  food is equal to the following:              - ONE slice of bread (or its equivalent, such as half of a hamburger bun).              - 1/2 cup of a "scoopable" starchy food such as potatoes or rice.              - 15 grams of total carbohydrate as shown on food label.  3. Include at every meal: a protein food, a carb food, and vegetables and/or fruit.              - Obtain twice the volume of veg's as protein or carbohydrate foods for both lunch and dinner.              - Fresh or frozen veg's are best.              - Keep frozen veg's on hand for a quick vegetable serving.              - More vegetables in your diet will also supply potassium, which will help your blood pressure.    - Eating in the ways suggested above will help you with appetite control.    - If you use canned soup, first microwave fresh or frozen veg's, add the soup, and reheat.    - TASTE PREFERENCES ARE LEARNED.  This means that it will get easier to choose foods you know are good for you if you are exposed to them enough.

## 2016-05-25 NOTE — Progress Notes (Signed)
Medical Nutrition Therapy:  Appt start time: 1100 end time:  1200. [name]  Assessment:  Primary concerns today: Weight management and Blood sugar control.  Randall Brown was seen in Nutrition clinic with Dr. Jimmey RalphParker last month. Since that time, he has been trying to "watch his food intake."  He was accompanied at today's visit by his wife Randall Brown, who expressed concerns about his apparent large portion sizes at meals.    Randall Brown's language includes a lot of "can't" and "impossible" words, e.g., as related to foods he can or cannot eat.  It was clear that he remembered nothing from our first visit in Nov 2017, so we reviewed those recommendations again today, and I reiterated that he should call if he misplaces his print-out after any appt.  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    24-hr recall:  (Up at 7 AM) B (7 AM)-  2 c coffee, 1/2 tsp stevia Snk (10:30)-  3 scrmbled eggs (in butter in nonstick pan), onion, pepper, tom, 2 sausage patties, 2 oz ham, 1 slc Amer chs, 2 toast, 2 tbsp butter, 2 tsp jelly, 2 c 1% milk, 2 c coffee, stevia L ( PM)-  water Snk ( PM)-  --- D (7 PM)-  7-8 oz hamburger patty, 1 swt potato, 2 tbsp butter, 1/2 c sauteed onions&peppers, 1 1/2 c mixed veg's, large side salad, 4 tbsp oil&vinegar drsng, 12 c 1% milk Snk (8 PM)-  2 snack-sz Reeses cups, water Typical day? Yes.  Sometimes does eat 3 meals, but worked thru lunch yesterday, as often happens.   Learning Readiness: Ready   FBG have been running around 120-125 most days, although was 247 one day last week.  He does not know why it was especially high on that day, which prompted another reminder to immediately write down what, how much, and what time he last ate every time he sees an unusual BG reading.  I also asked him to start recording BGs  on form provided today.    Progress Towards Goal(s):  In progress.   Nutritional Diagnosis:   NB-1.1 Food and nutrition-related knowledge deficit As related to glycemic control.  As evidenced by A1C of 7.1.    Intervention:  Nutrition education.   Handouts given during visit include:  AVS  Demonstrated degree of understanding via:  Teach Back  Barriers to learning/adherence to lifestyle change: Long-standing dietary habits as well as difficulty exercising b/c of hip pain.    Monitoring/Evaluation:  Dietary intake, exercise, FBG, and body weight in 4 week(s).

## 2016-05-29 ENCOUNTER — Telehealth: Payer: Self-pay | Admitting: *Deleted

## 2016-05-29 NOTE — Telephone Encounter (Signed)
[facility] pharmacy called needing clarification in Rx for Toprol XL 50 mg.  Patient also has an active Rx for Metoprolol tartrate 50 mg. Please give them a call at [phone], use reference number [id number].  [place] PuMartin, Tamika L, RN

## 2016-05-29 NOTE — Telephone Encounter (Signed)
Patient was switched to toprol XL because he said his insurance was no longer covering metoprolol tartrate.  Katina Degreealeb M. Jimmey RalphParker, MD [facility] Resident PGY-3 05/29/2016 1:23 PM

## 2016-05-29 NOTE — Telephone Encounter (Signed)
Called [facility] pharmacy to clarify medication. Informed the pharmacist that patient should be on Toprol XL 50 mg per Dr. Jimmey RalphParker.  [place] PuMartin, Tamika L, RN

## 2016-06-15 ENCOUNTER — Encounter: Payer: Self-pay | Admitting: Family Medicine

## 2016-06-19 ENCOUNTER — Other Ambulatory Visit: Payer: Self-pay | Admitting: *Deleted

## 2016-06-19 ENCOUNTER — Ambulatory Visit (INDEPENDENT_AMBULATORY_CARE_PROVIDER_SITE_OTHER): Payer: Medicare Other | Admitting: Family Medicine

## 2016-06-19 ENCOUNTER — Encounter: Payer: Self-pay | Admitting: Family Medicine

## 2016-06-19 VITALS — BP 142/83 | HR 69 | Ht 65.0 in | Wt 200.0 lb

## 2016-06-19 DIAGNOSIS — M25571 Pain in right ankle and joints of right foot: Secondary | ICD-10-CM | POA: Diagnosis present

## 2016-06-19 NOTE — Assessment & Plan Note (Addendum)
Feelings of instability more than problems with pain. We'll place him on home exercise program. Given his history of prior fibular fracture in no imaging available to us, I will get ankle x-rays to rule out some type of bony instability from prior injury. Greater than 50% of our 25 minute office visit was spent in counseling and education regarding these issues as well as instruction in HEP I'll see him back in 4 weeks.

## 2016-06-19 NOTE — Addendum Note (Signed)
Addended byDenny Levy: NEAL, SARA L on: 06/19/2016 03:23 PM   Modules accepted: Level of Service

## 2016-06-19 NOTE — Patient Instructions (Signed)
HEP

## 2016-06-19 NOTE — Progress Notes (Signed)
  Jaquelyn BitterMichael Canion - 66 y.o. male MRN [id number]  Date of birth: 02/21/1951    SUBJECTIVE:      Chief Complaint:/ HPI:  Right Ankle pain. Ankle feels unstable. Has noted this for the last several months. Previous history of fibular fracture on the right several years ago. We have no records as this was [place] in [place] Felt like it healed totally although he still had some occasional stiffness. Only over the last few months has he noted a sensation of the ankle just not being "stable". He's noted no particular swelling of the ankle. He does have some occasional stiffness but not much true pain.   ROS:     No numbness in the right foot that is unusual for him over the last couple of months. He's noted no swelling and no unusual erythema or warmth of the right ankle. He's had no other sense of lower extremity weakness or instability that is changed from baseline.  PERTINENT  PMH / PSH FH / / SH:  Past Medical, Surgical, Social, and Family History Reviewed & Updated in the EMR.  Pertinent findings include:  Diabetes mellitus Hypertension Hyperlipidemia Elevated serum creatinine BPH History of right fibular fracture  OBJECTIVE: BP (!) 142/83   Pulse 69   Ht 5\' 5"  (1.651 m)   Wt 200 lb (90.7 kg)   BMI 33.28 kg/m   Physical Exam:  Vital signs are reviewed. Well-developed well-nourished, overweight, no acute distress FEET: Mild pes planus symmetrical. Neck ANKLES: Full range of motion in all planes. Normal strength in fevers and inversion dorsiflexion plantar flexion. Slight increased subtalar motion noted on inversion of the right ankle but this is painless. He can do 3-4 dull stance heel raises for he stopped secondary to fatigue he cannot do a single stance to raise on either side but on the right side he has pain associated with attempts. Next NEURO: Intact sensation bilateral feet. AFTER: Dorsalis pedis pulses are 1-2+ bilaterally symmetrical. Normal  Refill. SKIN: Skin of bilateral feet is normal without any sign of rash, no erythema, no lesions. GAIT: Toe out gait, normal stride length.  ASSESSMENT & PLAN:  See problem based charting & AVS for pt instructions.

## 2016-06-24 ENCOUNTER — Ambulatory Visit: Payer: Medicare Other | Admitting: Family Medicine

## 2016-06-24 ENCOUNTER — Other Ambulatory Visit: Payer: Self-pay | Admitting: Family Medicine

## 2016-06-24 ENCOUNTER — Encounter: Payer: Self-pay | Admitting: Obstetrics and Gynecology

## 2016-06-24 ENCOUNTER — Ambulatory Visit (INDEPENDENT_AMBULATORY_CARE_PROVIDER_SITE_OTHER): Payer: Medicare Other | Admitting: Obstetrics and Gynecology

## 2016-06-24 VITALS — BP 120/68 | HR 97 | Temp 98.6°F | Wt 198.0 lb

## 2016-06-24 DIAGNOSIS — J069 Acute upper respiratory infection, unspecified: Secondary | ICD-10-CM | POA: Diagnosis not present

## 2016-06-24 DIAGNOSIS — K219 Gastro-esophageal reflux disease without esophagitis: Secondary | ICD-10-CM | POA: Diagnosis present

## 2016-06-24 MED ORDER — PANTOPRAZOLE SODIUM 20 MG PO TBEC
20.0000 mg | DELAYED_RELEASE_TABLET | Freq: Every day | ORAL | 1 refills | Status: DC
Start: 2016-06-24 — End: 2016-08-14

## 2016-06-24 NOTE — Progress Notes (Signed)
   Subjective:   Patient ID: Jaquelyn BitterMichael Fetch, male    DOB: 08/19/1950, 66 y.o.   MRN: [id number]  Patient presents for Same Day Appointment  Chief Complaint  Patient presents with  . Emesis  . Diarrhea    HPI: # URI He states that he has been feeling sick since Sunday. He has associated dry cough. Had a fever yesterday 100.41F. Take Tylenol and fever resolved. Also endorsing some chest discomfort, dyspnea, back pain, and sore throat. Denies any sick contacts. Received flu vaccine this year. Had a one-time episode of diarrhea and emesis. Endorsing some nausea intermittently.  Sneezing: no  Headache: yes  Muscle aches: no  Severe fatigue: no  Stiff neck: no  Rash: no  Sore throat: yes  Review of Systems   See HPI for ROS.   History  Smoking Status  . Former Smoker  . Quit date: 10/22/2006  Smokeless Tobacco  . Never Used    Past medical history, surgical, family, and social history reviewed and updated in the EMR as appropriate.  Pertinent Historical Findings include: GERD Objective:  BP 120/68   Pulse 97   Temp 98.6 F (37 C) (Oral)   Wt 198 lb (89.8 kg)   SpO2 95%   BMI 32.95 kg/m  Vitals and nursing note reviewed  Physical Exam  Constitutional: He is well-developed, well-nourished, and in no distress.  HENT:  Right Ear: Tympanic membrane, external ear and ear canal normal.  Left Ear: Tympanic membrane, external ear and ear canal normal.  Nose: Nose normal.  Mouth/Throat: Oropharynx is clear and moist.  Eyes: Conjunctivae and EOM are normal. Pupils are equal, round, and reactive to light.  Neck: Normal range of motion. Neck supple.  Cardiovascular: Normal rate, regular rhythm and normal heart sounds.   Pulmonary/Chest: Effort normal and breath sounds normal. He exhibits no tenderness.  Abdominal: Soft. Normal appearance. There is tenderness in the epigastric area. There is no rebound and no guarding.  Skin: Skin is warm and dry. No rash noted.     Assessment & Plan:  Please see separate assessment and plan  Diagnosis and plan along with any newly prescribed medication(s) were discussed in detail with this patient today. The patient verbalized understanding and agreed with the plan. Patient advised if symptoms worsen return to clinic or ER.   PATIENT EDUCATION PROVIDED: See AVS   Caryl AdaJazma Phelps, DO 06/24/2016, 3:28 PM PGY-3, [facility]

## 2016-06-24 NOTE — Patient Instructions (Signed)
Upper Respiratory Infection, Adult Most upper respiratory infections (URIs) are caused by a virus. A URI affects the nose, throat, and upper air passages. The most common type of URI is often called "the common cold." Follow these instructions at home:  Take medicines only as told by your doctor.  Gargle warm saltwater or take cough drops to comfort your throat as told by your doctor.  Use a warm mist humidifier or inhale steam from a shower to increase air moisture. This may make it easier to breathe.  Drink enough fluid to keep your pee (urine) clear or pale yellow.  Eat soups and other clear broths.  Have a healthy diet.  Rest as needed.  Go back to work when your fever is gone or your doctor says it is okay.  You may need to stay home longer to avoid giving your URI to others.  You can also wear a face mask and wash your hands often to prevent spread of the virus.  Use your inhaler more if you have asthma.  Do not use any tobacco products, including cigarettes, chewing tobacco, or electronic cigarettes. If you need help quitting, ask your doctor. Contact a doctor if:  You are getting worse, not better.  Your symptoms are not helped by medicine.  You have chills.  You are getting more short of breath.  You have brown or red mucus.  You have yellow or brown discharge from your nose.  You have pain in your face, especially when you bend forward.  You have a fever.  You have puffy (swollen) neck glands.  You have pain while swallowing.  You have white areas in the back of your throat. Get help right away if:  You have very bad or constant:  Headache.  Ear pain.  Pain in your forehead, behind your eyes, and over your cheekbones (sinus pain).  Chest pain.  You have long-lasting (chronic) lung disease and any of the following:  Wheezing.  Long-lasting cough.  Coughing up blood.  A change in your usual mucus.  You have a stiff neck.  You have  changes in your:  Vision.  Hearing.  Thinking.  Mood. This information is not intended to replace advice given to you by your health care provider. Make sure you discuss any questions you have with your health care provider. Document Released: 10/14/2007 Document Revised: 12/29/2015 Document Reviewed: 08/02/2013 Elsevier Interactive Patient Education  2017 Elsevier Inc.  

## 2016-06-24 NOTE — Assessment & Plan Note (Signed)
Continues to have persistent symptoms that are not controlled with ranitidine. Patient switched to a trial of PPI. Follow-up with PCP.

## 2016-06-24 NOTE — Assessment & Plan Note (Signed)
Symtpoms consistent with upper respiratory infection. Patient without any red flags. Does not appear to be a flulike illness. Conservative treatment. Patient told to treat symptomatically with over-the-counter medications. Vitals stable and patient well appearing.

## 2016-06-29 ENCOUNTER — Ambulatory Visit (INDEPENDENT_AMBULATORY_CARE_PROVIDER_SITE_OTHER): Payer: Medicare Other | Admitting: Family Medicine

## 2016-06-29 ENCOUNTER — Encounter: Payer: Self-pay | Admitting: Family Medicine

## 2016-06-29 ENCOUNTER — Ambulatory Visit (HOSPITAL_COMMUNITY)
Admission: RE | Admit: 2016-06-29 | Discharge: 2016-06-29 | Disposition: A | Discharge status: Home / Self Care | Payer: Medicare Other | Source: Ambulatory Visit | Attending: Family Medicine | Admitting: Family Medicine

## 2016-06-29 VITALS — BP 128/72 | HR 86 | Temp 98.0°F | Ht 65.0 in | Wt 195.0 lb

## 2016-06-29 DIAGNOSIS — R05 Cough: Secondary | ICD-10-CM

## 2016-06-29 DIAGNOSIS — R059 Cough, unspecified: Secondary | ICD-10-CM

## 2016-06-29 DIAGNOSIS — R918 Other nonspecific abnormal finding of lung field: Secondary | ICD-10-CM | POA: Diagnosis not present

## 2016-06-29 MED ORDER — BENZONATATE 100 MG PO CAPS: 100.0000 mg | ORAL_CAPSULE | Freq: Two times a day (BID) | ORAL | 0 refills | Status: DC | PRN

## 2016-06-29 NOTE — Patient Instructions (Signed)
Upper Respiratory Infection, Adult Most upper respiratory infections (URIs) are a viral infection of the air passages leading to the lungs. A URI affects the nose, throat, and upper air passages. The most common type of URI is nasopharyngitis and is typically referred to as "the common cold." URIs run their course and usually go away on their own. Most of the time, a URI does not require medical attention, but sometimes a bacterial infection in the upper airways can follow a viral infection. This is called a secondary infection. Sinus and middle ear infections are common types of secondary upper respiratory infections. Bacterial pneumonia can also complicate a URI. A URI can worsen asthma and chronic obstructive pulmonary disease (COPD). Sometimes, these complications can require emergency medical care and may be life threatening. What are the causes? Almost all URIs are caused by viruses. A virus is a type of germ and can spread from one person to another. What increases the risk? You may be at risk for a URI if:  You smoke.  You have chronic heart or lung disease.  You have a weakened defense (immune) system.  You are very young or very old.  You have nasal allergies or asthma.  You work in crowded or poorly ventilated areas.  You work in health care facilities or schools.  What are the signs or symptoms? Symptoms typically develop 2-3 days after you come in contact with a cold virus. Most viral URIs last 7-10 days. However, viral URIs from the influenza virus (flu virus) can last 14-18 days and are typically more severe. Symptoms may include:  Runny or stuffy (congested) nose.  Sneezing.  Cough.  Sore throat.  Headache.  Fatigue.  Fever.  Loss of appetite.  Pain in your forehead, behind your eyes, and over your cheekbones (sinus pain).  Muscle aches.  How is this diagnosed? Your health care provider may diagnose a URI by:  Physical exam.  Tests to check that your  symptoms are not due to another condition such as: ? Strep throat. ? Sinusitis. ? Pneumonia. ? Asthma.  How is this treated? A URI goes away on its own with time. It cannot be cured with medicines, but medicines may be prescribed or recommended to relieve symptoms. Medicines may help:  Reduce your fever.  Reduce your cough.  Relieve nasal congestion.  Follow these instructions at home:  Take medicines only as directed by your health care provider.  Gargle warm saltwater or take cough drops to comfort your throat as directed by your health care provider.  Use a warm mist humidifier or inhale steam from a shower to increase air moisture. This may make it easier to breathe.  Drink enough fluid to keep your urine clear or pale yellow.  Eat soups and other clear broths and maintain good nutrition.  Rest as needed.  Return to work when your temperature has returned to normal or as your health care provider advises. You may need to stay home longer to avoid infecting others. You can also use a face mask and careful hand washing to prevent spread of the virus.  Increase the usage of your inhaler if you have asthma.  Do not use any tobacco products, including cigarettes, chewing tobacco, or electronic cigarettes. If you need help quitting, ask your health care provider. How is this prevented? The best way to protect yourself from getting a cold is to practice good hygiene.  Avoid oral or hand contact with people with cold symptoms.  Wash your   hands often if contact occurs.  There is no clear evidence that vitamin C, vitamin E, echinacea, or exercise reduces the chance of developing a cold. However, it is always recommended to get plenty of rest, exercise, and practice good nutrition. Contact a health care provider if:  You are getting worse rather than better.  Your symptoms are not controlled by medicine.  You have chills.  You have worsening shortness of breath.  You have  brown or red mucus.  You have yellow or brown nasal discharge.  You have pain in your face, especially when you bend forward.  You have a fever.  You have swollen neck glands.  You have pain while swallowing.  You have white areas in the back of your throat. Get help right away if:  You have severe or persistent: ? Headache. ? Ear pain. ? Sinus pain. ? Chest pain.  You have chronic lung disease and any of the following: ? Wheezing. ? Prolonged cough. ? Coughing up blood. ? A change in your usual mucus.  You have a stiff neck.  You have changes in your: ? Vision. ? Hearing. ? Thinking. ? Mood. This information is not intended to replace advice given to you by your health care provider. Make sure you discuss any questions you have with your health care provider. Document Released: 10/21/2000 Document Revised: 12/29/2015 Document Reviewed: 08/02/2013 Elsevier Interactive Patient Education  2017 [employer]  

## 2016-06-30 ENCOUNTER — Ambulatory Visit: Payer: Medicare Other | Admitting: Family Medicine

## 2016-06-30 NOTE — Progress Notes (Signed)
Subjective:     Patient ID: Randall Brown, male   DOB: 05/18/1950, 66 y.o.   MRN: [id number]  HPI Mr. Randall Brown is a 66yo male presenting today for cough and abdominal pain. Recently seen on 2/14 for fever, diarrhea, vomiting, epigastric tenderness, and cough. Was diagnosed with GERD (Ranitidine transitioned to Protonix) and URI. Today, continues to note cough which he reports has been present for 10 days and getting worse. Cough is not productive. He is concerned because 4 years ago he had nausea, vomiting, diarrhea, abdominal pain, and a mild cough and was diagnosed with severe diverticulitis [place] Reports abdominal pain is epigastric and constant, worsening with cough. Had fever one week ago. Continues to note diarrhea, which mostly occurs with cough, nonbloody. Wife is currently admitted for pneumonia. Denies headache, body aches, flu exposure. Former Smoker.  Review of Systems Per HPI    Objective:   Physical Exam  Constitutional: He appears well-developed and well-nourished. No distress.  HENT:  Head: Normocephalic and atraumatic.  Mouth/Throat: Oropharynx is clear and moist.  Cardiovascular: Normal rate and regular rhythm.   No murmur heard. Pulmonary/Chest: Effort normal. No respiratory distress. He has no wheezes.  Abdominal: Soft. He exhibits no distension.  Paraumbilical and Epigastric tenderness. Positive Carnetts. Negative Rebound. Negative Murphy's. Negative Rovsing.  Musculoskeletal: He exhibits no edema.  Skin: No rash noted.  Psychiatric: He has a normal mood and affect. His behavior is normal.       Assessment and Plan:     1. Cough Now present for >10 days. Wife admitted is currently admitted for pneumonia. Abdominal pain most likely muscle strain from cough given Carnett. Given prolonged symptoms with worsening and wife's admission, will obtain CXR. Will determine need for antibiotics based on results. Follow up if symptoms  worsen or fail to improve.

## 2016-07-01 ENCOUNTER — Ambulatory Visit (INDEPENDENT_AMBULATORY_CARE_PROVIDER_SITE_OTHER): Payer: Medicare Other | Admitting: Podiatry

## 2016-07-01 DIAGNOSIS — E0843 Diabetes mellitus due to underlying condition with diabetic autonomic (poly)neuropathy: Secondary | ICD-10-CM | POA: Diagnosis not present

## 2016-07-01 DIAGNOSIS — M216X2 Other acquired deformities of left foot: Secondary | ICD-10-CM | POA: Diagnosis not present

## 2016-07-01 DIAGNOSIS — M2040 Other hammer toe(s) (acquired), unspecified foot: Secondary | ICD-10-CM | POA: Diagnosis not present

## 2016-07-01 DIAGNOSIS — M2041 Other hammer toe(s) (acquired), right foot: Secondary | ICD-10-CM

## 2016-07-01 DIAGNOSIS — M2042 Other hammer toe(s) (acquired), left foot: Secondary | ICD-10-CM

## 2016-07-01 DIAGNOSIS — M216X1 Other acquired deformities of right foot: Secondary | ICD-10-CM | POA: Diagnosis not present

## 2016-07-01 NOTE — Progress Notes (Signed)
Patient presents for diabetic shoe pick up, shoes are tried on for good fit.  Patient received 1 Pair and 3 pairs custom molded diabetic inserts.  Verbal and written break in and wear instructions given.  Patient will follow up for scheduled routine care.   

## 2016-07-01 NOTE — Patient Instructions (Signed)

## 2016-07-03 ENCOUNTER — Telehealth: Payer: Self-pay | Admitting: Family Medicine

## 2016-07-03 ENCOUNTER — Encounter: Payer: Self-pay | Admitting: Family Medicine

## 2016-07-03 MED ORDER — LEVOFLOXACIN 500 MG PO TABS: 500.0000 mg | ORAL_TABLET | Freq: Every day | ORAL | 0 refills | Status: DC

## 2016-07-03 NOTE — Telephone Encounter (Signed)
Wife called back wanting to know if patient should be working with this diagnosis.  Hesham Womac,CMA

## 2016-07-03 NOTE — Telephone Encounter (Signed)
Levaquin sent to pharmacy for possible pneumonia on CXR.

## 2016-07-07 ENCOUNTER — Encounter: Payer: Self-pay | Admitting: Family Medicine

## 2016-07-07 MED ORDER — TAMSULOSIN HCL 0.4 MG PO CAPS: 0.8000 mg | ORAL_CAPSULE | Freq: Every day | ORAL | 0 refills | Status: DC

## 2016-07-07 MED ORDER — TAMSULOSIN HCL 0.4 MG PO CAPS: 0.8000 mg | ORAL_CAPSULE | Freq: Every day | ORAL | 3 refills | Status: DC

## 2016-07-07 NOTE — Telephone Encounter (Signed)
Rx sent in.  Randall Degreealeb M. Jimmey RalphParker, MD [facility] Resident PGY-3 07/07/2016 12:12 PM

## 2016-07-08 MED ORDER — TAMSULOSIN HCL 0.4 MG PO CAPS: 0.8000 mg | ORAL_CAPSULE | Freq: Every day | ORAL | 0 refills | Status: AC

## 2016-07-08 MED FILL — TAMSULOSIN HCL 0.4 MG CAP: 0.4 | 10 days supply | Qty: 20 | Fill #0

## 2016-07-10 ENCOUNTER — Ambulatory Visit: Payer: Medicare Other | Admitting: Family Medicine

## 2016-07-14 ENCOUNTER — Ambulatory Visit
Admission: RE | Admit: 2016-07-14 | Discharge: 2016-07-14 | Disposition: A | Discharge status: Home / Self Care | Payer: Medicare Other | Source: Ambulatory Visit | Attending: Family Medicine | Admitting: Family Medicine

## 2016-07-14 DIAGNOSIS — M25571 Pain in right ankle and joints of right foot: Secondary | ICD-10-CM

## 2016-07-15 ENCOUNTER — Encounter: Payer: Self-pay | Admitting: Family Medicine

## 2016-08-03 ENCOUNTER — Other Ambulatory Visit: Payer: Self-pay | Admitting: Family Medicine

## 2016-08-03 DIAGNOSIS — I10 Essential (primary) hypertension: Secondary | ICD-10-CM

## 2016-08-03 DIAGNOSIS — E785 Hyperlipidemia, unspecified: Secondary | ICD-10-CM

## 2016-08-06 ENCOUNTER — Other Ambulatory Visit (INDEPENDENT_AMBULATORY_CARE_PROVIDER_SITE_OTHER): Payer: Medicare Other

## 2016-08-06 DIAGNOSIS — E118 Type 2 diabetes mellitus with unspecified complications: Secondary | ICD-10-CM

## 2016-08-06 DIAGNOSIS — E785 Hyperlipidemia, unspecified: Secondary | ICD-10-CM

## 2016-08-06 DIAGNOSIS — I10 Essential (primary) hypertension: Secondary | ICD-10-CM

## 2016-08-06 LAB — POCT GLYCOSYLATED HEMOGLOBIN (HGB A1C): Hemoglobin A1C: 6.7

## 2016-08-07 LAB — COMPREHENSIVE METABOLIC PANEL
ALT: 24 IU/L (ref 0–44)
AST: 25 IU/L (ref 0–40)
Albumin/Globulin Ratio: 1.7 (ref 1.2–2.2)
Albumin: 4.3 g/dL (ref 3.6–4.8)
Alkaline Phosphatase: 55 IU/L (ref 39–117)
BILIRUBIN TOTAL: 0.3 mg/dL (ref 0.0–1.2)
BUN / CREAT RATIO: 26 — AB (ref 10–24)
BUN: 31 mg/dL — AB (ref 8–27)
CHLORIDE: 99 mmol/L (ref 96–106)
CO2: 19 mmol/L (ref 18–29)
Calcium: 9.5 mg/dL (ref 8.6–10.2)
Creatinine, Ser: 1.21 mg/dL (ref 0.76–1.27)
GFR calc Af Amer: 72 mL/min/{1.73_m2} (ref >59)
GFR calc non Af Amer: 62 mL/min/{1.73_m2} (ref >59)
GLUCOSE: 105 mg/dL — AB (ref 65–99)
Globulin, Total: 2.5 g/dL (ref 1.5–4.5)
Potassium: 4.9 mmol/L (ref 3.5–5.2)
Sodium: 136 mmol/L (ref 134–144)
Total Protein: 6.8 g/dL (ref 6.0–8.5)

## 2016-08-07 LAB — CBC
HEMOGLOBIN: 14.3 g/dL (ref 13.0–17.7)
Hematocrit: 42.8 % (ref 37.5–51.0)
MCH: 29 pg (ref 26.6–33.0)
MCHC: 33.4 g/dL (ref 31.5–35.7)
MCV: 87 fL (ref 79–97)
Platelets: 196 10*3/uL (ref 150–379)
RBC: 4.93 x10E6/uL (ref 4.14–5.80)
RDW: 15.3 % (ref 12.3–15.4)
WBC: 7.2 10*3/uL (ref 3.4–10.8)

## 2016-08-07 LAB — LIPID PANEL
CHOLESTEROL TOTAL: 149 mg/dL (ref 100–199)
Chol/HDL Ratio: 4.5 ratio units (ref 0.0–5.0)
HDL: 33 mg/dL — AB (ref >39)
LDL CALC: 72 mg/dL (ref 0–99)
TRIGLYCERIDES: 222 mg/dL — AB (ref 0–149)
VLDL Cholesterol Cal: 44 mg/dL — ABNORMAL HIGH (ref 5–40)

## 2016-08-07 LAB — MAGNESIUM: MAGNESIUM: 1.4 mg/dL — AB (ref 1.6–2.3)

## 2016-08-14 ENCOUNTER — Encounter: Payer: Self-pay | Admitting: Family Medicine

## 2016-08-14 ENCOUNTER — Ambulatory Visit (INDEPENDENT_AMBULATORY_CARE_PROVIDER_SITE_OTHER): Payer: Medicare Other | Admitting: Family Medicine

## 2016-08-14 DIAGNOSIS — N400 Enlarged prostate without lower urinary tract symptoms: Secondary | ICD-10-CM

## 2016-08-14 DIAGNOSIS — K219 Gastro-esophageal reflux disease without esophagitis: Secondary | ICD-10-CM

## 2016-08-14 DIAGNOSIS — E118 Type 2 diabetes mellitus with unspecified complications: Secondary | ICD-10-CM

## 2016-08-14 DIAGNOSIS — E785 Hyperlipidemia, unspecified: Secondary | ICD-10-CM

## 2016-08-14 DIAGNOSIS — I10 Essential (primary) hypertension: Secondary | ICD-10-CM

## 2016-08-14 MED ORDER — RANITIDINE HCL 75 MG PO TABS: 75.0000 mg | ORAL_TABLET | Freq: Two times a day (BID) | ORAL | 11 refills | Status: AC

## 2016-08-14 MED ORDER — GLIPIZIDE 5 MG PO TABS: 5.0000 mg | ORAL_TABLET | Freq: Every day | ORAL | 3 refills | Status: DC

## 2016-08-14 MED ORDER — LATANOPROST 0.005 % OP SOLN: 1.0000 [drp] | Freq: Every day | OPHTHALMIC | 12 refills | Status: AC

## 2016-08-14 NOTE — Assessment & Plan Note (Signed)
LDL 72. Continue lipitor.

## 2016-08-14 NOTE — Assessment & Plan Note (Signed)
Stable. Continue flomax.  

## 2016-08-14 NOTE — Assessment & Plan Note (Signed)
Mag slightly low. Instructed patient to continue supplements.

## 2016-08-14 NOTE — Progress Notes (Signed)
    Subjective:  Randall Brown is a 66 y.o. male who presents to the [facility] today with a chief complaint of T2DM.   HPI:  T2DM Currently on glipizide and metformin. Tolerating this without side effects. No polyuria or polydipsia. No hypoglycemic symptoms. GBGs usually in the 100s. He has had a few in the 200s after meals.   Hypertension BP Readings from Last 3 Encounters:  08/14/16 140/78  06/29/16 128/72  06/24/16 120/68   Home BP monitoring- Compliant with medications- On norvasc, lisinopril, and metoprolol. Tolerating without side effects ROS-Denies any CP, HA, SOB, blurry vision, LE edema, transient weakness, orthopnea, PND.   GERD Stable on ranitidine.  HLD Tolerating lipitor without side effects.  BPH On flomax. Tolerating well without symptoms.   ROS: Per HPI  PMH: Smoking history reviewed.   Objective:  Physical Exam: BP 140/78   Pulse 76   Temp 97.6 F (36.4 C) (Oral)   Ht  (1.651 m)   Wt 192 lb (87.1 kg)   SpO2 95%   BMI 31.95 kg/m   Gen: NAD, resting comfortably CV: RRR with no murmurs appreciated Pulm: NWOB, CTAB with no crackles, wheezes, or rhonchi GI: Normal bowel sounds present. Soft, Nontender, Nondistended. MSK: no edema, cyanosis, or clubbing noted Skin: warm, dry Neuro: grossly normal, moves all extremities Psych: Normal affect and thought content  Assessment/Plan:  Type 2 diabetes mellitus (HCC) A1c 6.7. Will continue current regimen.   Essential hypertension At goal on current regimen. Will continue.   GERD (gastroesophageal reflux disease) Stop protonix. Continue ranitidine.   BPH (benign prostatic hyperplasia) Stable. Continue flomax.   Hypomagnesemia Mag slightly low. Instructed patient to continue supplements.   Hyperlipidemia LDL 72. Continue lipitor.   Katina Degree. Jimmey Ralph, MD [facility] Resident PGY-3 08/14/2016 4:27 PM

## 2016-08-14 NOTE — Patient Instructions (Signed)
It was so nice getting to know you!  We are not making any changes today.  Let us know if you need anything from [place].  Take care,  Dr Jimmey Ralph

## 2016-08-14 NOTE — Assessment & Plan Note (Signed)
At goal on current regimen. Will continue.

## 2016-08-14 NOTE — Assessment & Plan Note (Signed)
Stop protonix. Continue ranitidine.

## 2016-08-14 NOTE — Assessment & Plan Note (Signed)
A1c 6.7. Will continue current regimen.

## 2017-03-09 ENCOUNTER — Other Ambulatory Visit: Payer: Self-pay | Admitting: Family Medicine

## 2017-05-31 ENCOUNTER — Other Ambulatory Visit: Payer: Self-pay | Admitting: Family Medicine

## 2018-04-07 ENCOUNTER — Other Ambulatory Visit: Payer: Self-pay | Admitting: Family Medicine

## 2018-05-30 IMAGING — DX DG CHEST 2V
3 series · 3 of 3 positions shown · non-contrast
Comparison: None.

CLINICAL DATA: Acute onset of productive cough and fever. Initial
encounter.

EXAM:
CHEST  2 VIEW

[chest pa (1 of 2)]
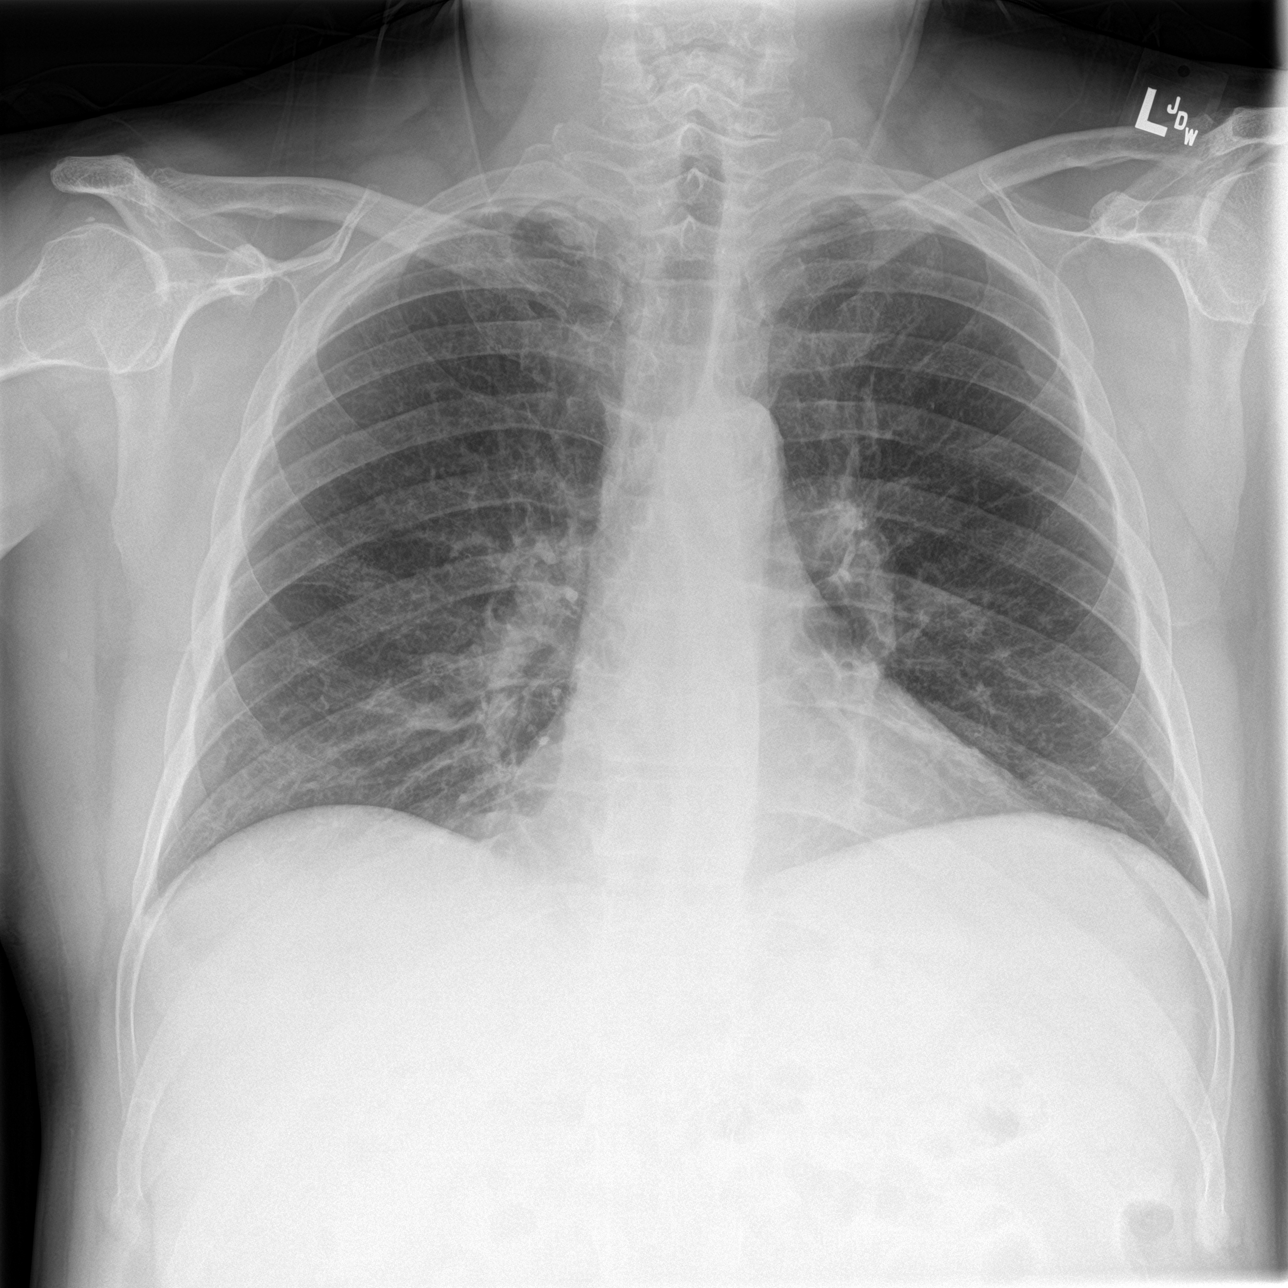

[chest lat]
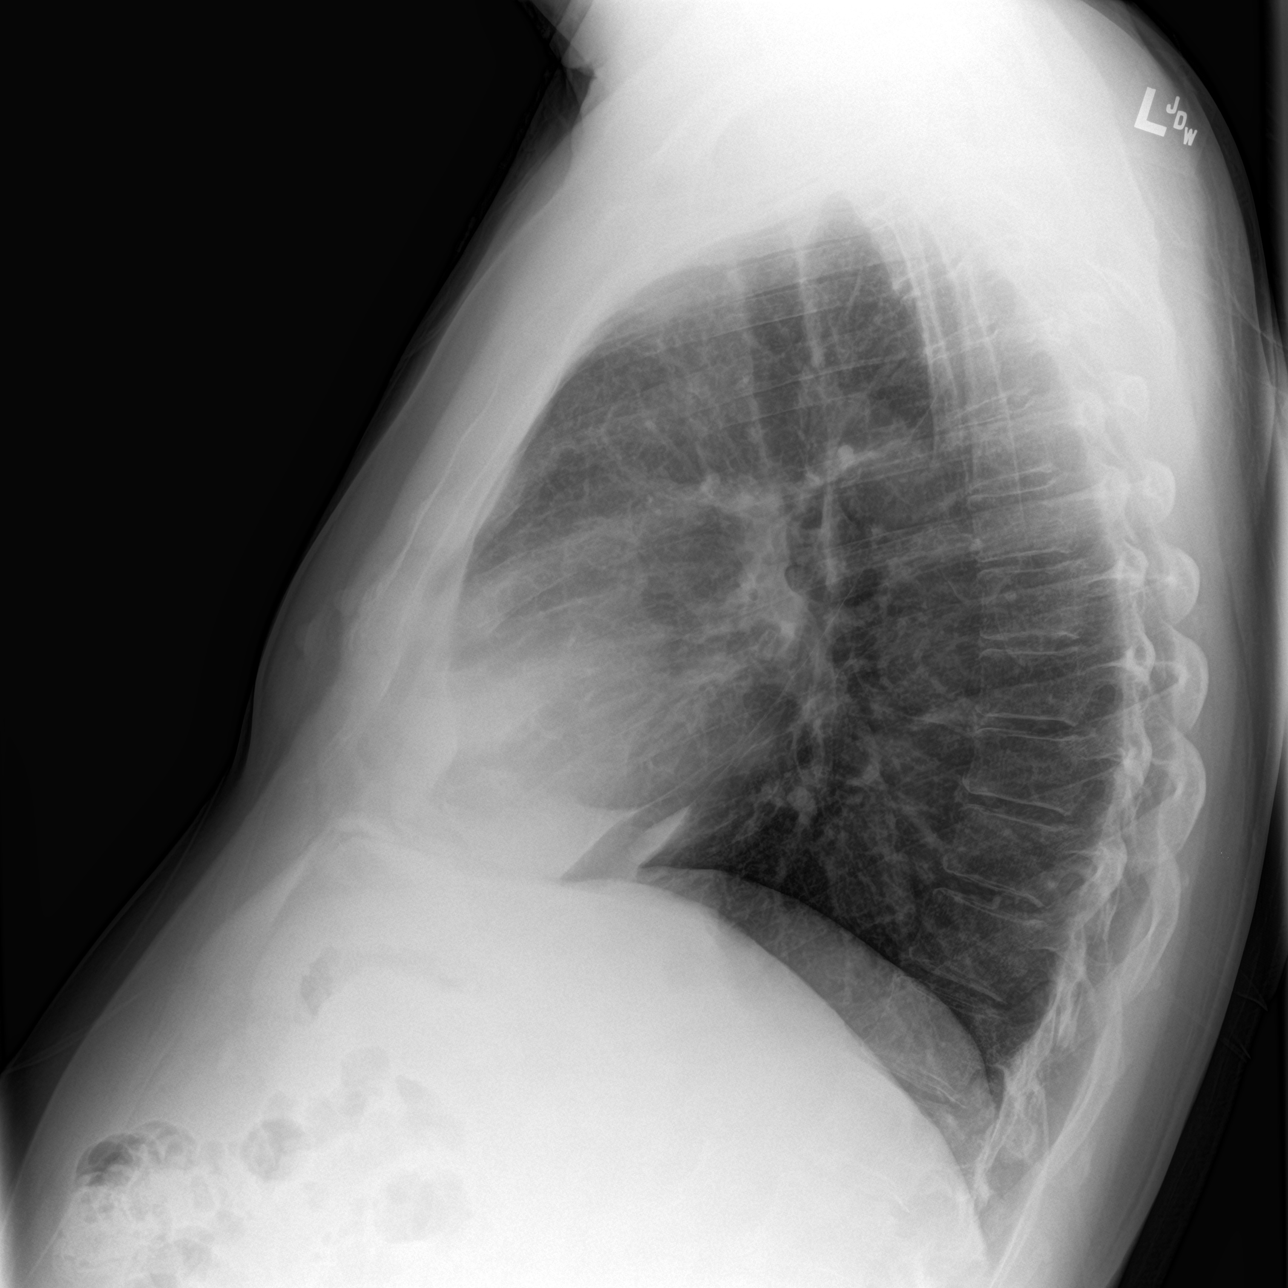

[chest pa (2 of 2)]
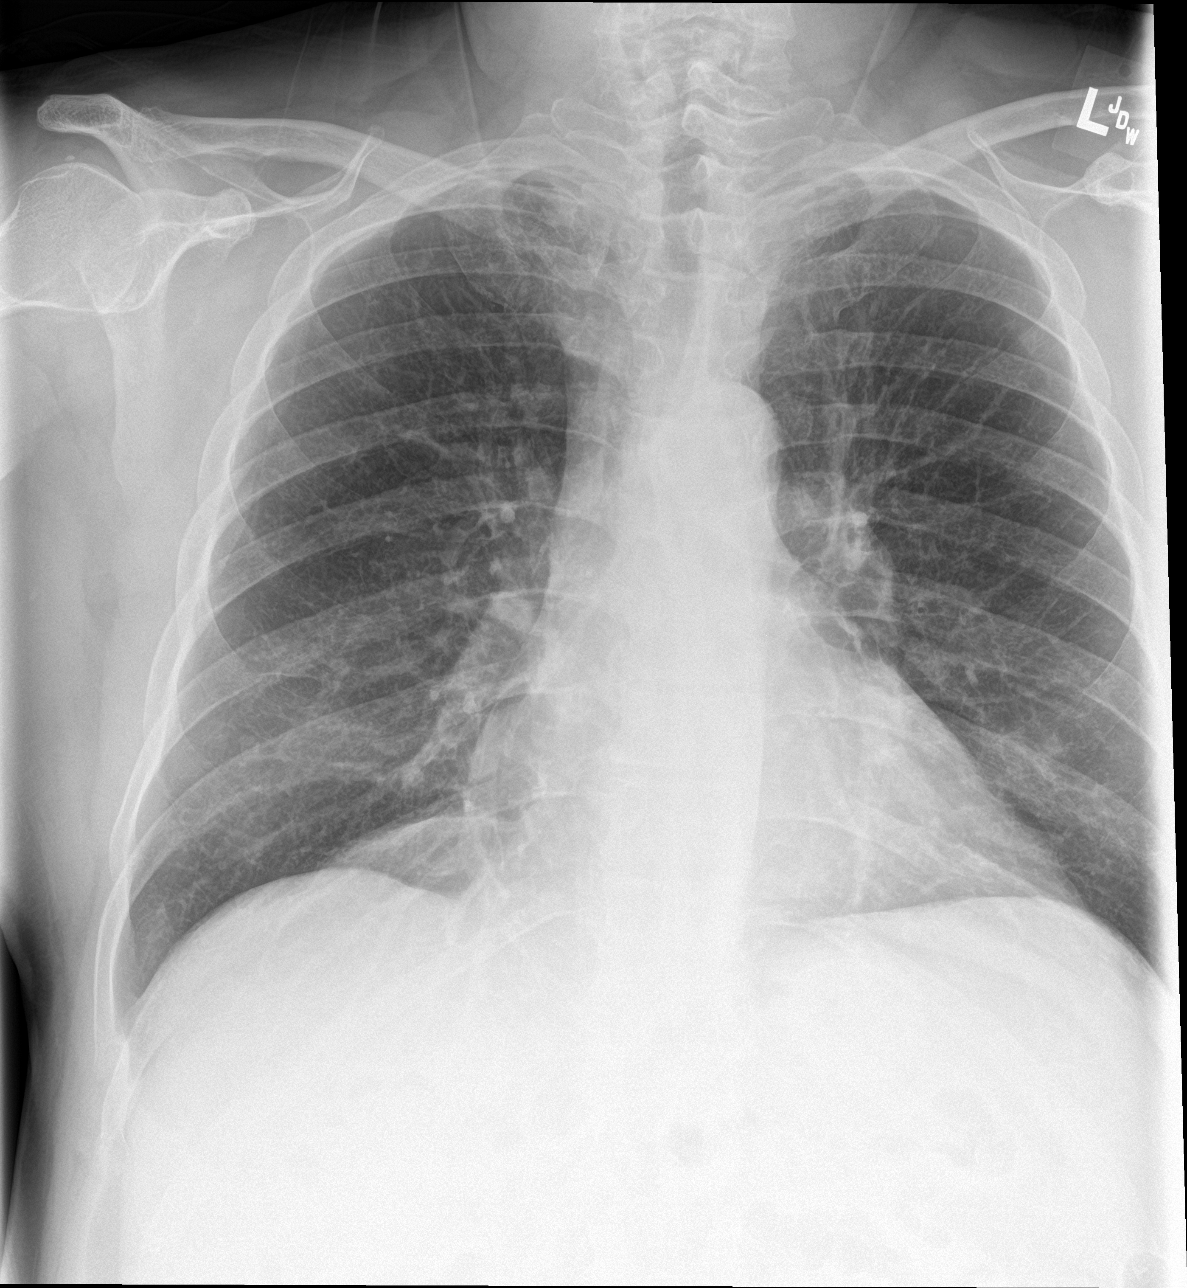

[3 of 3 positions shown; findings below may reference images not displayed]

FINDINGS: The lungs are well-aerated. There is question of mild lingular and
right lower lobe opacity, which could reflect mild pneumonia. There
is no evidence of pleural effusion or pneumothorax.

The heart is normal in size; the mediastinal contour is within
normal limits. No acute osseous abnormalities are seen.
IMPRESSION: Question of mild lingular and right lower lobe opacity, which could
reflect mild pneumonia.
